# Patient Record
Sex: Male | Born: 1970 | Hispanic: Yes | Marital: Single | Smoking: Never smoker
Health system: Southern US, Community
[De-identification: ages and names within clinical notes are randomized; demographics above are authoritative.]

## PROBLEM LIST (undated history)

## (undated) DIAGNOSIS — H11003 Unspecified pterygium of eye, bilateral: Secondary | ICD-10-CM

## (undated) DIAGNOSIS — E785 Hyperlipidemia, unspecified: Secondary | ICD-10-CM

## (undated) DIAGNOSIS — S0990XA Unspecified injury of head, initial encounter: Secondary | ICD-10-CM

## (undated) HISTORY — DX: Hyperlipidemia, unspecified: E78.5

## (undated) HISTORY — PX: NO PAST SURGERIES: SHX2092

## (undated) HISTORY — DX: Unspecified pterygium of eye, bilateral: H11.003

---

## 1998-04-02 ENCOUNTER — Emergency Department (HOSPITAL_COMMUNITY): Admission: EM | Admit: 1998-04-02 | Discharge: 1998-04-02 | Payer: Self-pay | Admitting: Emergency Medicine

## 1999-08-31 ENCOUNTER — Emergency Department (HOSPITAL_COMMUNITY): Admission: EM | Admit: 1999-08-31 | Discharge: 1999-08-31 | Payer: Self-pay | Admitting: Emergency Medicine

## 2001-12-12 ENCOUNTER — Emergency Department (HOSPITAL_COMMUNITY): Admission: EM | Admit: 2001-12-12 | Discharge: 2001-12-12 | Payer: Self-pay | Admitting: Emergency Medicine

## 2002-06-30 ENCOUNTER — Emergency Department (HOSPITAL_COMMUNITY): Admission: EM | Admit: 2002-06-30 | Discharge: 2002-06-30 | Payer: Self-pay | Admitting: Emergency Medicine

## 2002-06-30 ENCOUNTER — Encounter: Payer: Self-pay | Admitting: Emergency Medicine

## 2004-10-26 ENCOUNTER — Emergency Department (HOSPITAL_COMMUNITY): Admission: EM | Admit: 2004-10-26 | Discharge: 2004-10-26 | Payer: Self-pay | Admitting: Emergency Medicine

## 2005-03-02 ENCOUNTER — Emergency Department (HOSPITAL_COMMUNITY): Admission: EM | Admit: 2005-03-02 | Discharge: 2005-03-03 | Payer: Self-pay | Admitting: Emergency Medicine

## 2011-02-14 ENCOUNTER — Emergency Department (HOSPITAL_COMMUNITY)
Admission: EM | Admit: 2011-02-14 | Discharge: 2011-02-14 | Disposition: A | Discharge status: Home / Self Care | Payer: Self-pay | Attending: Emergency Medicine | Admitting: Emergency Medicine

## 2011-02-14 DIAGNOSIS — L299 Pruritus, unspecified: Secondary | ICD-10-CM | POA: Insufficient documentation

## 2011-02-14 DIAGNOSIS — L408 Other psoriasis: Secondary | ICD-10-CM | POA: Insufficient documentation

## 2011-09-09 ENCOUNTER — Emergency Department (HOSPITAL_COMMUNITY)
Admission: EM | Admit: 2011-09-09 | Discharge: 2011-09-09 | Disposition: A | Discharge status: Home / Self Care | Payer: Self-pay | Attending: Emergency Medicine | Admitting: Emergency Medicine

## 2011-09-09 DIAGNOSIS — R21 Rash and other nonspecific skin eruption: Secondary | ICD-10-CM | POA: Insufficient documentation

## 2011-09-09 DIAGNOSIS — L2989 Other pruritus: Secondary | ICD-10-CM | POA: Insufficient documentation

## 2011-09-09 DIAGNOSIS — L259 Unspecified contact dermatitis, unspecified cause: Secondary | ICD-10-CM | POA: Insufficient documentation

## 2011-09-09 DIAGNOSIS — L298 Other pruritus: Secondary | ICD-10-CM | POA: Insufficient documentation

## 2011-10-03 ENCOUNTER — Emergency Department (HOSPITAL_COMMUNITY)
Admission: EM | Admit: 2011-10-03 | Discharge: 2011-10-03 | Disposition: A | Discharge status: Home / Self Care | Payer: Self-pay | Attending: Emergency Medicine | Admitting: Emergency Medicine

## 2011-10-03 DIAGNOSIS — R21 Rash and other nonspecific skin eruption: Secondary | ICD-10-CM | POA: Insufficient documentation

## 2011-10-03 DIAGNOSIS — L259 Unspecified contact dermatitis, unspecified cause: Secondary | ICD-10-CM | POA: Insufficient documentation

## 2011-10-03 DIAGNOSIS — M79609 Pain in unspecified limb: Secondary | ICD-10-CM | POA: Insufficient documentation

## 2012-05-28 ENCOUNTER — Encounter (HOSPITAL_COMMUNITY): Payer: Self-pay | Admitting: Emergency Medicine

## 2012-05-28 ENCOUNTER — Emergency Department (HOSPITAL_COMMUNITY)
Admission: EM | Admit: 2012-05-28 | Discharge: 2012-05-28 | Disposition: A | Discharge status: Home / Self Care | Payer: Self-pay | Attending: Emergency Medicine | Admitting: Emergency Medicine

## 2012-05-28 DIAGNOSIS — R21 Rash and other nonspecific skin eruption: Secondary | ICD-10-CM

## 2012-05-28 DIAGNOSIS — B350 Tinea barbae and tinea capitis: Secondary | ICD-10-CM

## 2012-05-28 MED ORDER — HYDROXYZINE HCL 25 MG PO TABS: 25.0000 mg | ORAL_TABLET | Freq: Four times a day (QID) | ORAL | Status: AC

## 2012-05-28 MED ORDER — GRISEOFULVIN MICROSIZE 500 MG PO TABS: 500.0000 mg | ORAL_TABLET | Freq: Every day | ORAL | Status: AC

## 2012-05-28 NOTE — Discharge Instructions (Signed)
Infeccin por hongos de la piel (Fungus Infection of the Skin) Una infeccin en la piel causada por un hongo es un problema muy comn. El tratamiento depende de la parte del cuerpo que est afectada. Los tipos de infeccin fngica incluyen:  Pie de atleta (tinea pedis). Esta infeccin comienza entre los dedos de los pies y puede involucrar toda la planta y lados de los pies. Es la enfermedad por hongos ms frecuente. Empeora con el calor, la humedad y la friccin. Para tratarla, lvese los pies entre 2 y 3 veces por Futures trader. Squelos bien, [employer]. Use polvo para pies o cremas con medicamentos, segn las indicaciones del envase. Puede Curator las medias y zapatos talco comn, [place] o polvo de arroz para Pharmacologist los pies secos. Evitar calzado oclusivo tambin es muy til. Tambin es conveniente usar calzado deportivo que permita la ventilacin.   Tia (Tinea corporis y tinea capitis). Esta infeccin ocasiona anillos rojizos y [employer] se forman en la piel o cuero cabeludo. Para las llagas de la piel, aplique una locin medicada o crema segn lo indique en el envase. Para el cuero cabelludo, puede utilizar un champ medicinal junto con otras terapias. La tia del cuero cabelludo normalmente requiere la utilizacin de un medicamento oral por 2 a 4 meses.   Tia versicolor. Esta infeccin aparece como zonas de piel decolorada, escamosa, y despareja (blanquecina a marrn claro). Es ms comn en el verano y en zonas grasosas de la piel como aquellas que se encuentran el pecho, abdomen, espalda, pubis, cuello y pliegues de la piel. Puede tratarse con champ medicinal o con cremas de uso tpico. Los antifngicos por va oral son necesarios en caso de infecciones ms activas. Los puntos claros u oscuros pueden tomar Astronomer y no son un sntoma de Education administrator.  Las infecciones fngicas pueden tomar hasta varias semanas en curarse. Es importante no tratar las  infecciones fngicas con esteroides o medicamentos combinados que contengan antifngicos y esteroides porque stos podran hacer que la infeccin empeore. Consulte con el profesional que lo asiste si no mejora en [employer]. SOLICITE ATENCIN MDICA SI:  Tiene picazn o asperezas persistentes.   La temperatura oral se eleva sin motivo por arriba de 102 F (38.9 C).  Document Released: 12/08/2005 Document Revised: 11/27/2011 [facility] Patient Information 2012 [place], [place].    Resource Guide (Spanish)   Problemas Dentales  Pacientes con Medicaid    :                                                               [facility]   [employer] [phone]                          [phone] [address] [place], [place]                          [place], [place]                                             Phone:  [phone]               Phone:  [phone] Si usted no puede pagar o no tiene Water engineer con: [facility] o el departamento de salud de [facility] para que le autoricen el uso de la Therapist, art para adultos.  Problemas con Dolor Crnico Pngase en contacto con la clinica de dolores cronicos en el hospital de [place], [phone].   Los pacientes deben ser [employer] por su medico de cabecera.  Si usted no tiene dinero para pagar sus medicinas Pngase en contacto con [employer]:  llame al "211" o Insurance underwriter.   Phone: [phone]  Si usted no tiene mdico de [employer]      Phone: [phone]. Otras agencias que ofrecen cuidado mdico barato son:      [facility] [place]  [phone]      [facility]  [phone]      [facility]  [phone]      [facility]  [phone]      [facility]  [phone]      [facility]  [phone]  [facility]  [phone] [facility]  [phone] [facility]   [phone]  (servicios de emergencia [phone])  Abuse/Neglect [facility] Child Abuse Hotline [phone] [facility] Child Abuse Hotline [phone] (After Hours)  Emergency Shelter [facility] [phone]  Maternity Homes Room at the [place] [facility] [place] [phone] [place] Alert Services [phone]  MRSA Hotline #:   (941)684-7599  Servicios de [facility] [place]    United Way           [facility] Dept. [address]                                    [address]       [address]                                         Cristobal Goldmann Phone:  [phone]                            Phone: [phone]                Phone: [phone]  [facility] Phone:  [phone]  [facility] Child Abuse Hotline [phone] (After Hours)

## 2012-05-28 NOTE — ED Provider Notes (Signed)
History   This chart was scribed for Clinton Kras, MD by Shari Heritage. The patient was seen in room STRE1/STRE1. Patient's care was started at 1547.     CSN: [id number]  Arrival date & time 05/28/12  1547   None     Chief Complaint  Patient presents with  . Rash    (Consider location/radiation/quality/duration/timing/severity/associated sxs/prior treatment) The history is provided by the patient and a relative. No language interpreter was used.   Clinton Anderson is a 41 y.o. male who presents to the Emergency Department complaining of rash to back of head and left hand with associated itching. Patient denies pain. Patient has visited the ED for the same rash and was given cream. Patient says that cream gets rid of the rash, but it eventually comes back. Patient does not have a PCP. Patient reports no other pertinent medical or surgical histories. Patient has never smoked.     History reviewed. No pertinent past medical history.  History reviewed. No pertinent past surgical history.  History reviewed. No pertinent family history.  History  Substance Use Topics  . Smoking status: Never Smoker   . Smokeless tobacco: Not on file  . Alcohol Use: Yes     occasion      Review of Systems A complete 10 system review of systems was obtained and all systems are negative except as noted in the HPI and PMH.   Allergies  Review of patient's allergies indicates no known allergies.  Home Medications  No current outpatient prescriptions on file.  BP 117/75  Pulse 82  Temp(Src) 98.3 F (36.8 C) (Oral)  Resp 18  SpO2 100%  Physical Exam  Nursing note and vitals reviewed. Constitutional: He appears well-developed and well-nourished. No distress.  HENT:  Head: Normocephalic and atraumatic.  Right Ear: External ear normal.  Left Ear: External ear normal.  Eyes: Conjunctivae are normal. Right eye exhibits no discharge. Left eye exhibits no discharge. No scleral icterus.  Neck:  Neck supple. No tracheal deviation present.  Cardiovascular: Normal rate.   Pulmonary/Chest: Effort normal. No stridor. No respiratory distress.  Musculoskeletal: He exhibits no edema.  Neurological: He is alert. Cranial nerve deficit: no gross deficits.  Skin: Skin is warm and dry. No rash noted. Rash is not papular and not pustular. There is erythema (Posterior scalp).       Calloused area on hands. On posterior scalp area there is an area of scaling and erythema 3x3 cm area. No alopecia.  Psychiatric: He has a normal mood and affect.    ED Course  Procedures (including critical care time) DIAGNOSTIC STUDIES: Oxygen Saturation is 100% on room air, normal by my interpretation.    COORDINATION OF CARE: 5:02PM - Patient informed of current plan for treatment and evaluation and agrees with plan at this time. Will prescribe anti-fungal medication and medication to alleviate itching. Will provide names of PCP and dermatologist for patient to follow up.   Labs Reviewed - No data to display No results found.   1. Rash   2. Tinea capitis       MDM  Patient's scalp rash appears consistent with tinea capitis. The lesions on his hands are consistent with calluses. At this time there does not appear to be evidence of an acute emergency medical condition. I instructed the patient to not drink any alcohol while taking this medication. I recommended he follow up with primary care Dr. or consider seeing a dermatologist  I personally performed the services described in this documentation, which was scribed in my presence.  The recorded information has been reviewed and considered.    Clinton Kras, MD 05/28/12 [phone]

## 2012-05-28 NOTE — ED Notes (Signed)
Pt noticed rash to back of head and to L hand; c/o itching, denies pain- reports has been here before and given medicine (cream) to place on it and it does get rid of rash, but always comes back

## 2012-08-22 ENCOUNTER — Encounter (HOSPITAL_COMMUNITY): Payer: Self-pay | Admitting: *Deleted

## 2012-08-22 ENCOUNTER — Emergency Department (HOSPITAL_COMMUNITY)
Admission: EM | Admit: 2012-08-22 | Discharge: 2012-08-22 | Disposition: A | Discharge status: Home / Self Care | Payer: Self-pay | Attending: Emergency Medicine | Admitting: Emergency Medicine

## 2012-08-22 DIAGNOSIS — R220 Localized swelling, mass and lump, head: Secondary | ICD-10-CM | POA: Insufficient documentation

## 2012-08-22 DIAGNOSIS — H9209 Otalgia, unspecified ear: Secondary | ICD-10-CM | POA: Insufficient documentation

## 2012-08-22 DIAGNOSIS — R221 Localized swelling, mass and lump, neck: Secondary | ICD-10-CM | POA: Insufficient documentation

## 2012-08-22 NOTE — ED Notes (Signed)
Pt states facial swelling x 3 days. Pt has a slight redness to left side of face near left ear. Pt states hard to hear from left ear and painful. Pt states lots of water coming out of ear.

## 2012-08-22 NOTE — ED Notes (Signed)
Called no answer

## 2012-12-22 DIAGNOSIS — S0990XA Unspecified injury of head, initial encounter: Secondary | ICD-10-CM

## 2012-12-22 HISTORY — DX: Unspecified injury of head, initial encounter: S09.90XA

## 2013-05-14 ENCOUNTER — Encounter (HOSPITAL_COMMUNITY): Payer: Self-pay | Admitting: Family Medicine

## 2013-05-14 ENCOUNTER — Emergency Department (HOSPITAL_COMMUNITY): Payer: Self-pay

## 2013-05-14 ENCOUNTER — Emergency Department (HOSPITAL_COMMUNITY)
Admission: EM | Admit: 2013-05-14 | Discharge: 2013-05-14 | Disposition: A | Discharge status: Home / Self Care | Payer: Self-pay | Attending: Emergency Medicine | Admitting: Emergency Medicine

## 2013-05-14 DIAGNOSIS — S0180XA Unspecified open wound of other part of head, initial encounter: Secondary | ICD-10-CM | POA: Insufficient documentation

## 2013-05-14 DIAGNOSIS — S0990XA Unspecified injury of head, initial encounter: Secondary | ICD-10-CM | POA: Insufficient documentation

## 2013-05-14 DIAGNOSIS — S0101XA Laceration without foreign body of scalp, initial encounter: Secondary | ICD-10-CM

## 2013-05-14 DIAGNOSIS — F1010 Alcohol abuse, uncomplicated: Secondary | ICD-10-CM | POA: Insufficient documentation

## 2013-05-14 NOTE — ED Notes (Signed)
Dr. Patria Mane at bedside for assessment
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
## 2013-05-14 NOTE — ED Notes (Signed)
Upon this RN arriving to room pt had removed C-collar. Pt instructed to keep collar on, pt reminded of importance of C-collar. Pt's son at bedside to translate. C-collar put back on by this RN
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
## 2013-05-14 NOTE — ED Notes (Signed)
Family members at bedside
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
## 2013-05-14 NOTE — ED Notes (Signed)
Pt returned from radiology.

## 2013-05-14 NOTE — ED Notes (Signed)
Pt comfortable with d/c and f/u instructions. Pt verbalized understanding. Pt's son at bedside for translation, discharge instructions printed in Spanish. No prescriptions

## 2013-05-14 NOTE — ED Notes (Signed)
Dr. Patria Mane at bedside to suture.

## 2013-05-14 NOTE — ED Notes (Signed)
C-collar removed per Dr. Patria Mane. Pt resting comfortably, respirations EU. Family remains at bedside. Bleeding controlled at laceration.
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
## 2013-05-14 NOTE — ED Notes (Signed)
Pt's daughter and son at bedside with [employer] and [employer]. Son states is able to translate. Son used for medical translation.

## 2013-05-15 ENCOUNTER — Encounter (HOSPITAL_COMMUNITY): Payer: Self-pay | Admitting: *Deleted

## 2013-05-15 ENCOUNTER — Emergency Department (HOSPITAL_COMMUNITY)
Admission: EM | Admit: 2013-05-15 | Discharge: 2013-05-15 | Disposition: A | Discharge status: Home / Self Care | Payer: Self-pay | Attending: Emergency Medicine | Admitting: Emergency Medicine

## 2013-05-15 DIAGNOSIS — Z4801 Encounter for change or removal of surgical wound dressing: Secondary | ICD-10-CM | POA: Insufficient documentation

## 2013-05-15 DIAGNOSIS — IMO0001 Reserved for inherently not codable concepts without codable children: Secondary | ICD-10-CM

## 2013-05-15 DIAGNOSIS — R51 Headache: Secondary | ICD-10-CM | POA: Insufficient documentation
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
## 2013-05-16 NOTE — ED Provider Notes (Signed)
Medical screening examination/treatment/procedure(s) were performed by non-physician practitioner and as supervising physician I was immediately available for consultation/collaboration.  Donnetta Hutching, MD 05/16/13 [phone]

## 2013-05-22 ENCOUNTER — Emergency Department (HOSPITAL_COMMUNITY)
Admission: EM | Admit: 2013-05-22 | Discharge: 2013-05-22 | Disposition: A | Discharge status: Home / Self Care | Payer: Self-pay | Attending: Emergency Medicine | Admitting: Emergency Medicine

## 2013-05-22 DIAGNOSIS — Y849 Medical procedure, unspecified as the cause of abnormal reaction of the patient, or of later complication, without mention of misadventure at the time of the procedure: Secondary | ICD-10-CM | POA: Insufficient documentation

## 2013-05-22 DIAGNOSIS — T888XXD Other specified complications of surgical and medical care, not elsewhere classified, subsequent encounter: Secondary | ICD-10-CM

## 2013-05-22 DIAGNOSIS — IMO0002 Reserved for concepts with insufficient information to code with codable children: Secondary | ICD-10-CM | POA: Insufficient documentation

## 2013-05-22 MED ORDER — MUPIROCIN CALCIUM 2 % EX CREA: TOPICAL_CREAM | Freq: Three times a day (TID) | CUTANEOUS | Status: DC

## 2013-05-22 MED ORDER — CEPHALEXIN 500 MG PO CAPS: 500.0000 mg | ORAL_CAPSULE | Freq: Three times a day (TID) | ORAL | Status: DC

## 2013-05-22 NOTE — ED Notes (Signed)
Pt given damp gauze to soak on laceration.

## 2013-05-22 NOTE — ED Notes (Signed)
Pt here to have head wound check. Stapled on Thursday. Swelling and redness noticed
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
## 2013-06-20 ENCOUNTER — Encounter (HOSPITAL_COMMUNITY): Payer: Self-pay | Admitting: Emergency Medicine

## 2013-06-20 ENCOUNTER — Emergency Department (HOSPITAL_COMMUNITY)
Admission: EM | Admit: 2013-06-20 | Discharge: 2013-06-20 | Disposition: A | Discharge status: Home / Self Care | Payer: Self-pay | Attending: Emergency Medicine | Admitting: Emergency Medicine

## 2013-06-20 DIAGNOSIS — Z4801 Encounter for change or removal of surgical wound dressing: Secondary | ICD-10-CM | POA: Insufficient documentation

## 2013-06-20 DIAGNOSIS — Z5189 Encounter for other specified aftercare: Secondary | ICD-10-CM
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
## 2013-06-21 NOTE — ED Provider Notes (Signed)
Medical screening examination/treatment/procedure(s) were performed by non-physician practitioner and as supervising physician I was immediately available for consultation/collaboration.  Saoirse Legere, MD 06/21/13 1536 

## 2014-01-24 ENCOUNTER — Emergency Department (HOSPITAL_COMMUNITY)
Admission: EM | Admit: 2014-01-24 | Discharge: 2014-01-24 | Disposition: A | Discharge status: Home / Self Care | Payer: Self-pay | Attending: Emergency Medicine | Admitting: Emergency Medicine

## 2014-01-24 ENCOUNTER — Encounter (HOSPITAL_COMMUNITY): Payer: Self-pay | Admitting: Emergency Medicine

## 2014-01-24 DIAGNOSIS — Z87828 Personal history of other (healed) physical injury and trauma: Secondary | ICD-10-CM | POA: Insufficient documentation

## 2014-01-24 DIAGNOSIS — R21 Rash and other nonspecific skin eruption: Secondary | ICD-10-CM | POA: Insufficient documentation

## 2014-01-24 HISTORY — DX: Unspecified injury of head, initial encounter: S09.90XA

## 2014-01-24 MED ORDER — MUPIROCIN CALCIUM 2 % EX CREA: 1.0000 "application " | TOPICAL_CREAM | Freq: Three times a day (TID) | CUTANEOUS | Status: AC

## 2014-01-24 MED ORDER — TERBINAFINE HCL 1 % EX CREA: 1.0000 "application " | TOPICAL_CREAM | Freq: Two times a day (BID) | CUTANEOUS | Status: AC
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
## 2014-01-24 NOTE — ED Notes (Signed)
Pt reports having a rash to his Left hand x3 months, states he has been here several times for the same

## 2014-01-24 NOTE — ED Provider Notes (Signed)
Medical screening examination/treatment/procedure(s) were performed by non-physician practitioner and as supervising physician I was immediately available for consultation/collaboration.  Charlotte Fidalgo E Colbi Staubs, MD 01/24/14 2054 

## 2014-01-24 NOTE — Discharge Instructions (Signed)
Candidiasis cutánea   (Cutaneous Candidiasis)  La candidiasis cutánea es un trastorno en el que hay un desarrollo excesivo de hongos (Cándida) en la piel. Los hongos normalmente viven en la piel, pero en pequeñas cantidades y no causan ningún síntoma. En ciertos casos, un mayor desarrollo de los hongos puede causar una verdadera infección por hongos. Este tipo de infección ocurre generalmente en áreas de la piel que son constantemente cálidas y húmedas, como las axilas o la ingle. El hongo es la causa más común de dermatitis del pañal en los bebés y en personas que no pueden controlar sus movimientos intestinales (incontinencia).   CAUSAS   El hongo que causa candidiasis cutánea con más frecuencia es Candida albicans. Las condiciones que pueden aumentar el riesgo de contraer una infección por hongos en la piel son:   · Obesidad.  · Embarazo.  · Diabetes.  · Tomar antibióticos.  · Tomar píldoras anticonceptivas.  · Tomar corticoides.  · La enfermedad tiroidea.  · Una deficiencia de hierro o zinc.  · Problemas del sistema inmunológico.  SÍNTOMAS   · Zona de la piel roja e hinchada.  · Bultos en la piel.  · Picazón.  DIAGNÓSTICO   El diagnóstico de la candidiasis cutánea se basa generalmente en su apariencia. Podrán realizarle unos ligeros raspados en la piel que se observarán bajo un microscopio para determinar la presencia de hongos.   TRATAMIENTO   Cremas antimicóticas pueden aplicarse sobre la piel infectada. En los casos graves, pueden ser necesario tomar medicamentos por vía oral.   INSTRUCCIONES PARA EL CUIDADO EN EL HOGAR   · Mantenga la piel limpia y seca.  · Mantenga un peso saludable.  · Si tiene diabetes, mantenga bajo control el nivel de azúcar en la sangre.  SOLICITE ATENCIÓN MÉDICA DE INMEDIATO SI:   · Su erupción continúa extendiéndose a pesar del tratamiento.  · Tiene fiebre, siente escalofríos o dolor abdominal.  Document Released: 11/27/2011 Document Revised: 03/01/2012  [employer]® Patient Information  ©2014 [employer].

## 2014-10-10 ENCOUNTER — Encounter (HOSPITAL_COMMUNITY): Payer: Self-pay | Admitting: Emergency Medicine

## 2014-10-10 ENCOUNTER — Emergency Department (HOSPITAL_COMMUNITY): Payer: No Typology Code available for payment source

## 2014-10-10 ENCOUNTER — Emergency Department (HOSPITAL_COMMUNITY)
Admission: EM | Admit: 2014-10-10 | Discharge: 2014-10-10 | Disposition: A | Discharge status: Home / Self Care | Payer: No Typology Code available for payment source | Attending: Emergency Medicine | Admitting: Emergency Medicine

## 2014-10-10 DIAGNOSIS — S299XXA Unspecified injury of thorax, initial encounter: Secondary | ICD-10-CM | POA: Diagnosis not present

## 2014-10-10 DIAGNOSIS — S8992XA Unspecified injury of left lower leg, initial encounter: Secondary | ICD-10-CM | POA: Insufficient documentation

## 2014-10-10 DIAGNOSIS — Z79899 Other long term (current) drug therapy: Secondary | ICD-10-CM | POA: Diagnosis not present

## 2014-10-10 DIAGNOSIS — M25562 Pain in left knee: Secondary | ICD-10-CM

## 2014-10-10 DIAGNOSIS — R0789 Other chest pain: Secondary | ICD-10-CM

## 2014-10-10 DIAGNOSIS — Y92410 Unspecified street and highway as the place of occurrence of the external cause: Secondary | ICD-10-CM | POA: Diagnosis not present

## 2014-10-10 DIAGNOSIS — Y9389 Activity, other specified: Secondary | ICD-10-CM | POA: Diagnosis not present

## 2014-10-10 MED ORDER — HYDROCODONE-ACETAMINOPHEN 5-325 MG PO TABS
2.0000 | ORAL_TABLET | Freq: Once | ORAL | Status: AC
  Administered 2014-10-10: 2 via ORAL
  Filled 2014-10-10: qty 2

## 2014-10-10 NOTE — ED Notes (Signed)
Pt ambulated to restroom with steady gait.

## 2014-10-10 NOTE — Discharge Instructions (Signed)
Return to the emergency room with worsening of symptoms, new symptoms or with symptoms that are concerning, especially chest pain that feels like a pressure, spreads to left arm or jaw, worse with exertion, associated with nausea, vomiting, shortness of breath and/or sweating.  RICE: Rest, Ice (three cycles of 20 mins on, 20mins off at least twice a day), compression/brace, elevation. Heating pad works well for back pain. Ibuprofen 400mg  (2 tablets 200mg ) every 5-6 hours for 3-5 days and then as needed for pain. Follow up with PCP or urgent care if symptoms worsen or are persistent.  You have been diagnosed by your caregiver as having chest wall pain. SEEK IMMEDIATE MEDICAL ATTENTION IF: You develop a fever.  Your chest pains become severe or intolerable.  You develop new, unexplained symptoms (problems).  You develop shortness of breath, nausea, vomiting, sweating or feel light headed.  You develop a new cough or you cough up blood.
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
## 2014-10-10 NOTE — ED Notes (Signed)
Pt also reporting left knee pain along with tenderness to chest with palpation.
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
## 2014-10-13 NOTE — ED Provider Notes (Signed)
Medical screening examination/treatment/procedure(s) were performed by non-physician practitioner and as supervising physician I was immediately available for consultation/collaboration.   EKG Interpretation None        Samuel JesterKathleen Amberlea Spagnuolo, DO 10/13/14 1531

## 2015-12-21 ENCOUNTER — Encounter: Payer: Self-pay | Admitting: Internal Medicine

## 2015-12-21 ENCOUNTER — Ambulatory Visit (INDEPENDENT_AMBULATORY_CARE_PROVIDER_SITE_OTHER): Payer: Self-pay | Admitting: Internal Medicine

## 2015-12-21 VITALS — BP 118/80 | HR 76 | Ht 59.0 in | Wt 148.0 lb

## 2015-12-21 DIAGNOSIS — Z23 Encounter for immunization: Secondary | ICD-10-CM

## 2015-12-21 DIAGNOSIS — H11003 Unspecified pterygium of eye, bilateral: Secondary | ICD-10-CM | POA: Insufficient documentation

## 2015-12-21 MED ORDER — CARBOXYMETHYLCELLULOSE SODIUM 0.5 % OP SOLN: OPHTHALMIC | Status: DC
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
## 2015-12-21 NOTE — Patient Instructions (Signed)

## 2016-02-08 ENCOUNTER — Encounter: Payer: Self-pay | Admitting: Internal Medicine

## 2017-01-05 ENCOUNTER — Emergency Department (HOSPITAL_COMMUNITY)
Admission: EM | Admit: 2017-01-05 | Discharge: 2017-01-05 | Disposition: A | Discharge status: Home / Self Care | Payer: Self-pay | Attending: Emergency Medicine | Admitting: Emergency Medicine

## 2017-01-05 DIAGNOSIS — B350 Tinea barbae and tinea capitis: Secondary | ICD-10-CM | POA: Insufficient documentation

## 2017-01-05 MED ORDER — TERBINAFINE HCL 250 MG PO TABS
250.0000 mg | ORAL_TABLET | Freq: Every day | ORAL | Status: DC
Start: 2017-01-05 — End: 2017-01-05
  Administered 2017-01-05: 250 mg via ORAL
  Filled 2017-01-05: qty 1

## 2017-01-05 MED ORDER — TERBINAFINE HCL 250 MG PO TABS: 250.0000 mg | ORAL_TABLET | Freq: Every day | ORAL | 0 refills | Status: AC

## 2017-01-05 NOTE — ED Notes (Signed)
plced in room waiting on provider

## 2017-01-05 NOTE — ED Notes (Signed)
Pt denies pain just having itchy sensation in the scalp.

## 2017-01-05 NOTE — ED Notes (Signed)
waitng on meds from pharmacy

## 2017-01-05 NOTE — ED Triage Notes (Signed)
Has a bump on back of head and front of head that itches , has been going on for a while
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
## 2017-04-09 ENCOUNTER — Encounter (HOSPITAL_COMMUNITY): Payer: Self-pay | Admitting: Emergency Medicine

## 2017-04-09 ENCOUNTER — Ambulatory Visit (HOSPITAL_COMMUNITY)
Admission: EM | Admit: 2017-04-09 | Discharge: 2017-04-09 | Disposition: A | Discharge status: Home / Self Care | Payer: Self-pay | Attending: Family Medicine | Admitting: Family Medicine

## 2017-04-09 DIAGNOSIS — H11002 Unspecified pterygium of left eye: Secondary | ICD-10-CM
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
## 2017-04-09 NOTE — ED Triage Notes (Signed)
Here for blurred vision onset x10 years... Reports hx of cataract on bilateral eyes  Also reports left eye irritation associated w/redness  Denies inj/trauma  Unable to obtain vision... Pt reports he can't read  A&O x4... NAD

## 2017-04-09 NOTE — Discharge Instructions (Signed)
You have a condition called pterygium. You will need to follow-up with an eye doctor. I recommend you go to Dr. Lanora Manis Mulberry's office, she is an internist, however she has the ability to refer people to ophthalmologists who do not have insurance. This type of eye growth will need to be surgically removed, and only an ophthalmologist continued this.

## 2017-04-14 ENCOUNTER — Emergency Department (HOSPITAL_COMMUNITY)
Admission: EM | Admit: 2017-04-14 | Discharge: 2017-04-14 | Disposition: A | Discharge status: Home / Self Care | Payer: Self-pay | Attending: Emergency Medicine | Admitting: Emergency Medicine

## 2017-04-14 ENCOUNTER — Encounter (HOSPITAL_COMMUNITY): Payer: Self-pay | Admitting: *Deleted

## 2017-04-14 DIAGNOSIS — R21 Rash and other nonspecific skin eruption: Secondary | ICD-10-CM | POA: Insufficient documentation

## 2017-04-14 LAB — COMPREHENSIVE METABOLIC PANEL
ALT: 31 U/L (ref 17–63)
AST: 31 U/L (ref 15–41)
Albumin: 4.2 g/dL (ref 3.5–5.0)
Alkaline Phosphatase: 108 U/L (ref 38–126)
Anion gap: 8 (ref 5–15)
BUN: 13 mg/dL (ref 6–20)
CALCIUM: 9.5 mg/dL (ref 8.9–10.3)
CHLORIDE: 100 mmol/L — AB (ref 101–111)
CO2: 27 mmol/L (ref 22–32)
CREATININE: 0.62 mg/dL (ref 0.61–1.24)
GFR calc Af Amer: >60 mL/min (ref >60)
GFR calc non Af Amer: >60 mL/min (ref >60)
Glucose, Bld: 95 mg/dL (ref 65–99)
Potassium: 4.1 mmol/L (ref 3.5–5.1)
SODIUM: 135 mmol/L (ref 135–145)
Total Bilirubin: 0.7 mg/dL (ref 0.3–1.2)
Total Protein: 7.7 g/dL (ref 6.5–8.1)

## 2017-04-14 MED ORDER — GRISEOFULVIN ULTRAMICROSIZE 125 MG PO TABS: 250.0000 mg | ORAL_TABLET | Freq: Every day | ORAL | 0 refills | Status: AC
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
## 2017-04-14 NOTE — ED Triage Notes (Signed)
States he was seen in Jan for a rash in his head and given Terbinafine HCL of which isn't  Helping , denies pain c/o itching

## 2017-04-14 NOTE — Discharge Instructions (Signed)
Take medications as prescribed.  Follow-up with her doctor to 2-4 days. If you Do not have a doctor follow up with the clinic listed below.  Return to emergency department for any worsening of the rash, fever, pain, difficult breathing, or any other reaction or worsening concerning symptoms

## 2017-05-21 ENCOUNTER — Ambulatory Visit: Payer: Self-pay | Admitting: Internal Medicine

## 2017-05-29 ENCOUNTER — Ambulatory Visit: Payer: Self-pay | Admitting: Internal Medicine

## 2017-09-13 ENCOUNTER — Inpatient Hospital Stay (HOSPITAL_COMMUNITY): Payer: Self-pay

## 2017-09-13 ENCOUNTER — Emergency Department (HOSPITAL_COMMUNITY): Payer: Self-pay

## 2017-09-13 ENCOUNTER — Inpatient Hospital Stay (HOSPITAL_COMMUNITY): Payer: Self-pay | Admitting: Certified Registered"

## 2017-09-13 ENCOUNTER — Encounter (HOSPITAL_COMMUNITY): Admission: EM | Disposition: A | Discharge status: Home / Self Care | Payer: Self-pay | Source: Home / Self Care

## 2017-09-13 ENCOUNTER — Encounter (HOSPITAL_COMMUNITY): Payer: Self-pay | Admitting: General Surgery

## 2017-09-13 ENCOUNTER — Inpatient Hospital Stay (HOSPITAL_COMMUNITY)
Admission: EM | Admit: 2017-09-13 | Discharge: 2017-09-15 | DRG: 958 | Disposition: A | Discharge status: Home / Self Care | Payer: Self-pay | Attending: General Surgery | Admitting: General Surgery

## 2017-09-13 DIAGNOSIS — F10129 Alcohol abuse with intoxication, unspecified: Secondary | ICD-10-CM | POA: Diagnosis present

## 2017-09-13 DIAGNOSIS — S61211A Laceration without foreign body of left index finger without damage to nail, initial encounter: Secondary | ICD-10-CM | POA: Diagnosis present

## 2017-09-13 DIAGNOSIS — S27322A Contusion of lung, bilateral, initial encounter: Secondary | ICD-10-CM | POA: Diagnosis present

## 2017-09-13 DIAGNOSIS — S61210A Laceration without foreign body of right index finger without damage to nail, initial encounter: Secondary | ICD-10-CM | POA: Diagnosis present

## 2017-09-13 DIAGNOSIS — Z23 Encounter for immunization: Secondary | ICD-10-CM

## 2017-09-13 DIAGNOSIS — S6402XA Injury of ulnar nerve at wrist and hand level of left arm, initial encounter: Principal | ICD-10-CM | POA: Diagnosis present

## 2017-09-13 DIAGNOSIS — S51811A Laceration without foreign body of right forearm, initial encounter: Secondary | ICD-10-CM | POA: Diagnosis present

## 2017-09-13 DIAGNOSIS — S0181XA Laceration without foreign body of other part of head, initial encounter: Secondary | ICD-10-CM | POA: Diagnosis present

## 2017-09-13 DIAGNOSIS — R402362 Coma scale, best motor response, obeys commands, at arrival to emergency department: Secondary | ICD-10-CM | POA: Diagnosis present

## 2017-09-13 DIAGNOSIS — R402132 Coma scale, eyes open, to sound, at arrival to emergency department: Secondary | ICD-10-CM | POA: Diagnosis present

## 2017-09-13 DIAGNOSIS — Z452 Encounter for adjustment and management of vascular access device: Secondary | ICD-10-CM

## 2017-09-13 DIAGNOSIS — S6401XA Injury of ulnar nerve at wrist and hand level of right arm, initial encounter: Secondary | ICD-10-CM | POA: Diagnosis present

## 2017-09-13 DIAGNOSIS — S060X9A Concussion with loss of consciousness of unspecified duration, initial encounter: Secondary | ICD-10-CM | POA: Diagnosis present

## 2017-09-13 DIAGNOSIS — S41119A Laceration without foreign body of unspecified upper arm, initial encounter: Secondary | ICD-10-CM | POA: Diagnosis present

## 2017-09-13 DIAGNOSIS — R402242 Coma scale, best verbal response, confused conversation, at arrival to emergency department: Secondary | ICD-10-CM | POA: Diagnosis present

## 2017-09-13 HISTORY — PX: I&D EXTREMITY: SHX5045

## 2017-09-13 LAB — ETHANOL: Alcohol, Ethyl (B): 260 mg/dL — ABNORMAL HIGH (ref <5)

## 2017-09-13 LAB — COMPREHENSIVE METABOLIC PANEL
ALBUMIN: 4.1 g/dL (ref 3.5–5.0)
ALT: 48 U/L (ref 17–63)
AST: 47 U/L — AB (ref 15–41)
Alkaline Phosphatase: 114 U/L (ref 38–126)
Anion gap: 13 (ref 5–15)
BUN: 6 mg/dL (ref 6–20)
CHLORIDE: 105 mmol/L (ref 101–111)
CO2: 19 mmol/L — AB (ref 22–32)
CREATININE: 0.76 mg/dL (ref 0.61–1.24)
Calcium: 8.8 mg/dL — ABNORMAL LOW (ref 8.9–10.3)
GFR calc Af Amer: >60 mL/min (ref >60)
GLUCOSE: 150 mg/dL — AB (ref 65–99)
POTASSIUM: 3.1 mmol/L — AB (ref 3.5–5.1)
Sodium: 137 mmol/L (ref 135–145)
Total Bilirubin: 0.6 mg/dL (ref 0.3–1.2)
Total Protein: 7.4 g/dL (ref 6.5–8.1)

## 2017-09-13 LAB — BPAM RBC
BLOOD PRODUCT EXPIRATION DATE: 201810122359
Blood Product Expiration Date: 201810112359
ISSUE DATE / TIME: 201809230317
ISSUE DATE / TIME: 201809230317
UNIT TYPE AND RH: 9500
Unit Type and Rh: 9500

## 2017-09-13 LAB — I-STAT CHEM 8, ED
BUN: 7 mg/dL (ref 6–20)
Calcium, Ion: 1.06 mmol/L — ABNORMAL LOW (ref 1.15–1.40)
Chloride: 105 mmol/L (ref 101–111)
Creatinine, Ser: 1 mg/dL (ref 0.61–1.24)
GLUCOSE: 150 mg/dL — AB (ref 65–99)
HCT: 44 % (ref 39.0–52.0)
HEMOGLOBIN: 15 g/dL (ref 13.0–17.0)
POTASSIUM: 3.2 mmol/L — AB (ref 3.5–5.1)
SODIUM: 142 mmol/L (ref 135–145)
TCO2: 20 mmol/L — AB (ref 22–32)

## 2017-09-13 LAB — TYPE AND SCREEN
ABO/RH(D): O POS
Antibody Screen: NEGATIVE
UNIT DIVISION: 0
Unit division: 0

## 2017-09-13 LAB — BPAM FFP
BLOOD PRODUCT EXPIRATION DATE: 201809242359
Blood Product Expiration Date: 201809232359
ISSUE DATE / TIME: 201809230318
ISSUE DATE / TIME: 201809230318
Unit Type and Rh: 600
Unit Type and Rh: 6200

## 2017-09-13 LAB — PREPARE FRESH FROZEN PLASMA
UNIT DIVISION: 0
Unit division: 0

## 2017-09-13 LAB — CDS SEROLOGY

## 2017-09-13 LAB — CBC
HEMATOCRIT: 41.7 % (ref 39.0–52.0)
Hemoglobin: 13.6 g/dL (ref 13.0–17.0)
MCH: 27.4 pg (ref 26.0–34.0)
MCHC: 32.6 g/dL (ref 30.0–36.0)
MCV: 83.9 fL (ref 78.0–100.0)
PLATELETS: 239 10*3/uL (ref 150–400)
RBC: 4.97 MIL/uL (ref 4.22–5.81)
RDW: 14.7 % (ref 11.5–15.5)
WBC: 5.5 10*3/uL (ref 4.0–10.5)

## 2017-09-13 LAB — ABO/RH: ABO/RH(D): O POS

## 2017-09-13 SURGERY — IRRIGATION AND DEBRIDEMENT EXTREMITY
Anesthesia: General | Site: Arm Lower | Laterality: Right

## 2017-09-13 MED ORDER — TETANUS-DIPHTH-ACELL PERTUSSIS 5-2.5-18.5 LF-MCG/0.5 IM SUSP
0.5000 mL | Freq: Once | INTRAMUSCULAR | Status: AC
  Administered 2017-09-13: 0.5 mL via INTRAMUSCULAR

## 2017-09-13 MED ORDER — PROPOFOL 10 MG/ML IV BOLUS
INTRAVENOUS | Status: AC
  Filled 2017-09-13: qty 20

## 2017-09-13 MED ORDER — ALBUMIN HUMAN 5 % IV SOLN
INTRAVENOUS | Status: DC | PRN
  Administered 2017-09-13 (×3): via INTRAVENOUS

## 2017-09-13 MED ORDER — HYDROMORPHONE HCL 1 MG/ML IJ SOLN
0.2500 mg | INTRAMUSCULAR | Status: DC | PRN
  Administered 2017-09-13 (×5): 0.5 mg via INTRAVENOUS

## 2017-09-13 MED ORDER — PANTOPRAZOLE SODIUM 40 MG PO TBEC
40.0000 mg | DELAYED_RELEASE_TABLET | Freq: Every day | ORAL | Status: DC
  Administered 2017-09-14 – 2017-09-15 (×2): 40 mg via ORAL
  Filled 2017-09-13 (×2): qty 1

## 2017-09-13 MED ORDER — SODIUM CHLORIDE 0.9 % IR SOLN
Status: DC | PRN
  Administered 2017-09-13: 3000 mL

## 2017-09-13 MED ORDER — PROMETHAZINE HCL 25 MG/ML IJ SOLN: 6.2500 mg | INTRAMUSCULAR | Status: DC | PRN

## 2017-09-13 MED ORDER — PHENYLEPHRINE HCL 10 MG/ML IJ SOLN
INTRAMUSCULAR | Status: DC | PRN
  Administered 2017-09-13 (×2): 80 ug via INTRAVENOUS

## 2017-09-13 MED ORDER — ONDANSETRON HCL 4 MG/2ML IJ SOLN
INTRAMUSCULAR | Status: DC | PRN
  Administered 2017-09-13: 4 mg via INTRAVENOUS

## 2017-09-13 MED ORDER — IOPAMIDOL (ISOVUE-300) INJECTION 61%
INTRAVENOUS | Status: AC
  Administered 2017-09-13: 100 mL
  Filled 2017-09-13: qty 100

## 2017-09-13 MED ORDER — LACTATED RINGERS IV SOLN
INTRAVENOUS | Status: DC | PRN
  Administered 2017-09-13 (×4): via INTRAVENOUS

## 2017-09-13 MED ORDER — FENTANYL CITRATE (PF) 100 MCG/2ML IJ SOLN
INTRAMUSCULAR | Status: DC | PRN
  Administered 2017-09-13: 50 ug via INTRAVENOUS
  Administered 2017-09-13: 100 ug via INTRAVENOUS

## 2017-09-13 MED ORDER — PHENYLEPHRINE HCL 10 MG/ML IJ SOLN
INTRAVENOUS | Status: DC | PRN
  Administered 2017-09-13: 10:00:00 via INTRAVENOUS
  Administered 2017-09-13: 60 ug/min via INTRAVENOUS

## 2017-09-13 MED ORDER — HYDROMORPHONE HCL 1 MG/ML IJ SOLN
INTRAMUSCULAR | Status: AC
  Filled 2017-09-13: qty 1

## 2017-09-13 MED ORDER — CEFAZOLIN SODIUM-DEXTROSE 2-4 GM/100ML-% IV SOLN
2.0000 g | Freq: Once | INTRAVENOUS | Status: AC
  Administered 2017-09-13: 2 g via INTRAVENOUS
  Filled 2017-09-13: qty 100

## 2017-09-13 MED ORDER — SODIUM CHLORIDE 0.9 % IV SOLN
INTRAVENOUS | Status: DC | PRN
  Administered 2017-09-13: 06:00:00 via INTRAVENOUS

## 2017-09-13 MED ORDER — PROPOFOL 10 MG/ML IV BOLUS
INTRAVENOUS | Status: DC | PRN
  Administered 2017-09-13: 150 mg via INTRAVENOUS

## 2017-09-13 MED ORDER — FENTANYL CITRATE (PF) 100 MCG/2ML IJ SOLN
50.0000 ug | Freq: Once | INTRAMUSCULAR | Status: AC
  Administered 2017-09-13: 50 ug via INTRAVENOUS

## 2017-09-13 MED ORDER — SUGAMMADEX SODIUM 200 MG/2ML IV SOLN
INTRAVENOUS | Status: DC | PRN
  Administered 2017-09-13: 150 mg via INTRAVENOUS

## 2017-09-13 MED ORDER — FENTANYL CITRATE (PF) 250 MCG/5ML IJ SOLN
INTRAMUSCULAR | Status: AC
  Filled 2017-09-13: qty 5

## 2017-09-13 MED ORDER — CEFAZOLIN SODIUM-DEXTROSE 1-4 GM/50ML-% IV SOLN
1.0000 g | Freq: Three times a day (TID) | INTRAVENOUS | Status: DC
  Administered 2017-09-13 – 2017-09-15 (×7): 1 g via INTRAVENOUS
  Filled 2017-09-13 (×8): qty 50

## 2017-09-13 MED ORDER — SENNOSIDES-DOCUSATE SODIUM 8.6-50 MG PO TABS
1.0000 | ORAL_TABLET | Freq: Two times a day (BID) | ORAL | Status: DC
  Administered 2017-09-13 – 2017-09-15 (×4): 1 via ORAL
  Filled 2017-09-13 (×4): qty 1

## 2017-09-13 MED ORDER — LIDOCAINE HCL (CARDIAC) 20 MG/ML IV SOLN
INTRAVENOUS | Status: DC | PRN
  Administered 2017-09-13: 80 mg via INTRAVENOUS

## 2017-09-13 MED ORDER — SUCCINYLCHOLINE CHLORIDE 20 MG/ML IJ SOLN
INTRAMUSCULAR | Status: DC | PRN
  Administered 2017-09-13: 120 mg via INTRAVENOUS

## 2017-09-13 MED ORDER — FENTANYL CITRATE (PF) 100 MCG/2ML IJ SOLN
INTRAMUSCULAR | Status: AC
  Filled 2017-09-13: qty 2

## 2017-09-13 MED ORDER — MIDAZOLAM HCL 2 MG/2ML IJ SOLN
INTRAMUSCULAR | Status: AC
  Filled 2017-09-13: qty 2

## 2017-09-13 MED ORDER — ONDANSETRON 4 MG PO TBDP
4.0000 mg | ORAL_TABLET | Freq: Four times a day (QID) | ORAL | Status: DC | PRN
  Filled 2017-09-13: qty 1

## 2017-09-13 MED ORDER — KCL IN DEXTROSE-NACL 20-5-0.45 MEQ/L-%-% IV SOLN
INTRAVENOUS | Status: DC
  Administered 2017-09-13 – 2017-09-15 (×3): via INTRAVENOUS
  Filled 2017-09-13 (×6): qty 1000

## 2017-09-13 MED ORDER — CEFAZOLIN SODIUM-DEXTROSE 2-3 GM-% IV SOLR
INTRAVENOUS | Status: DC | PRN
  Administered 2017-09-13 (×2): 2 g via INTRAVENOUS

## 2017-09-13 MED ORDER — PANTOPRAZOLE SODIUM 40 MG IV SOLR
40.0000 mg | Freq: Every day | INTRAVENOUS | Status: DC
Start: 2017-09-13 — End: 2017-09-14

## 2017-09-13 MED ORDER — 0.9 % SODIUM CHLORIDE (POUR BTL) OPTIME
TOPICAL | Status: DC | PRN
  Administered 2017-09-13: 1000 mL

## 2017-09-13 MED ORDER — OXYCODONE HCL 5 MG PO TABS
10.0000 mg | ORAL_TABLET | ORAL | Status: DC | PRN
  Administered 2017-09-13 – 2017-09-15 (×6): 10 mg via ORAL
  Filled 2017-09-13 (×6): qty 2

## 2017-09-13 MED ORDER — VITAMIN C 500 MG PO TABS
1000.0000 mg | ORAL_TABLET | Freq: Every day | ORAL | Status: DC
  Administered 2017-09-14 – 2017-09-15 (×2): 1000 mg via ORAL
  Filled 2017-09-13 (×2): qty 2

## 2017-09-13 MED ORDER — METHOCARBAMOL 500 MG PO TABS
500.0000 mg | ORAL_TABLET | Freq: Four times a day (QID) | ORAL | Status: DC | PRN
  Administered 2017-09-13 – 2017-09-14 (×3): 500 mg via ORAL
  Filled 2017-09-13 (×3): qty 1

## 2017-09-13 MED ORDER — ONDANSETRON HCL 4 MG/2ML IJ SOLN
4.0000 mg | Freq: Four times a day (QID) | INTRAMUSCULAR | Status: DC | PRN
  Administered 2017-09-13: 4 mg via INTRAVENOUS
  Filled 2017-09-13: qty 2

## 2017-09-13 MED ORDER — ROCURONIUM BROMIDE 100 MG/10ML IV SOLN
INTRAVENOUS | Status: DC | PRN
  Administered 2017-09-13: 20 mg via INTRAVENOUS
  Administered 2017-09-13: 30 mg via INTRAVENOUS
  Administered 2017-09-13 (×3): 10 mg via INTRAVENOUS
  Administered 2017-09-13: 20 mg via INTRAVENOUS

## 2017-09-13 MED ORDER — TETANUS-DIPHTH-ACELL PERTUSSIS 5-2.5-18.5 LF-MCG/0.5 IM SUSP
INTRAMUSCULAR | Status: AC
  Filled 2017-09-13: qty 0.5

## 2017-09-13 MED ORDER — ROCURONIUM BROMIDE 10 MG/ML (PF) SYRINGE
PREFILLED_SYRINGE | INTRAVENOUS | Status: AC
  Filled 2017-09-13: qty 5

## 2017-09-13 MED ORDER — MORPHINE SULFATE (PF) 4 MG/ML IV SOLN
2.0000 mg | INTRAVENOUS | Status: DC | PRN
  Administered 2017-09-13: 2 mg via INTRAVENOUS
  Administered 2017-09-13 – 2017-09-14 (×4): 4 mg via INTRAVENOUS
  Filled 2017-09-13 (×5): qty 1

## 2017-09-13 MED ORDER — CEFAZOLIN SODIUM 1 G IJ SOLR
INTRAMUSCULAR | Status: AC
  Filled 2017-09-13: qty 20

## 2017-09-13 SURGICAL SUPPLY — 64 items
BANDAGE ACE 4X5 VEL STRL LF (GAUZE/BANDAGES/DRESSINGS) ×3 IMPLANT
BANDAGE ELASTIC 4 VELCRO ST LF (GAUZE/BANDAGES/DRESSINGS) ×3 IMPLANT
BLADE SURG 15 STRL LF DISP TIS (BLADE) ×2 IMPLANT
BLADE SURG 15 STRL SS (BLADE) ×4
BNDG CONFORM 2 STRL LF (GAUZE/BANDAGES/DRESSINGS) IMPLANT
BNDG GAUZE ELAST 4 BULKY (GAUZE/BANDAGES/DRESSINGS) ×6 IMPLANT
CORD BIPOLAR FORCEPS 12FT (ELECTRODE) ×3 IMPLANT
CORDS BIPOLAR (ELECTRODE) ×6 IMPLANT
COVER SURGICAL LIGHT HANDLE (MISCELLANEOUS) ×3 IMPLANT
CUFF TOURNIQUET SINGLE 18IN (TOURNIQUET CUFF) ×6 IMPLANT
CUFF TOURNIQUET SINGLE 24IN (TOURNIQUET CUFF) IMPLANT
DRAPE EXTREMITY T 121X128X90 (DRAPE) ×3 IMPLANT
DRAPE HALF SHEET 40X57 (DRAPES) ×6 IMPLANT
DRSG ADAPTIC 3X8 NADH LF (GAUZE/BANDAGES/DRESSINGS) ×3 IMPLANT
DRSG EMULSION OIL 3X3 NADH (GAUZE/BANDAGES/DRESSINGS) ×6 IMPLANT
GAUZE SPONGE 4X4 12PLY STRL (GAUZE/BANDAGES/DRESSINGS) ×6 IMPLANT
GAUZE SPONGE 4X4 12PLY STRL LF (GAUZE/BANDAGES/DRESSINGS) ×3 IMPLANT
GAUZE SPONGE 4X4 16PLY XRAY LF (GAUZE/BANDAGES/DRESSINGS) ×6 IMPLANT
GLOVE BIOGEL M 8.0 STRL (GLOVE) ×3 IMPLANT
GLOVE SS BIOGEL STRL SZ 8 (GLOVE) ×1 IMPLANT
GLOVE SUPERSENSE BIOGEL SZ 8 (GLOVE) ×2
GOWN STRL REUS W/ TWL LRG LVL3 (GOWN DISPOSABLE) ×1 IMPLANT
GOWN STRL REUS W/ TWL XL LVL3 (GOWN DISPOSABLE) ×2 IMPLANT
GOWN STRL REUS W/TWL LRG LVL3 (GOWN DISPOSABLE) ×2
GOWN STRL REUS W/TWL XL LVL3 (GOWN DISPOSABLE) ×4
GUIDE NERVE NEURAGEN 1.5MM (Tissue) ×3 IMPLANT
KIT BASIN OR (CUSTOM PROCEDURE TRAY) ×3 IMPLANT
KIT ROOM TURNOVER OR (KITS) ×3 IMPLANT
MANIFOLD NEPTUNE II (INSTRUMENTS) ×3 IMPLANT
NS IRRIG 1000ML POUR BTL (IV SOLUTION) ×3 IMPLANT
PACK ORTHO EXTREMITY (CUSTOM PROCEDURE TRAY) ×3 IMPLANT
PAD ABD 8X10 STRL (GAUZE/BANDAGES/DRESSINGS) ×6 IMPLANT
PAD ARMBOARD 7.5X6 YLW CONV (MISCELLANEOUS) ×3 IMPLANT
PAD CAST 4YDX4 CTTN HI CHSV (CAST SUPPLIES) ×1 IMPLANT
PADDING CAST COTTON 4X4 STRL (CAST SUPPLIES) ×2
PADDING CAST SYNTHETIC 4 (CAST SUPPLIES) ×2
PADDING CAST SYNTHETIC 4X4 STR (CAST SUPPLIES) ×1 IMPLANT
SCRUB BETADINE 4OZ XXX (MISCELLANEOUS) ×6 IMPLANT
SET CYSTO W/LG BORE CLAMP LF (SET/KITS/TRAYS/PACK) ×6 IMPLANT
SOL PREP POV-IOD 4OZ 10% (MISCELLANEOUS) ×6 IMPLANT
SPLINT PLASTER CAST XFAST 5X30 (CAST SUPPLIES) ×1 IMPLANT
SPLINT PLASTER EXTRA FAST 3X15 (CAST SUPPLIES) ×2
SPLINT PLASTER GYPS XFAST 3X15 (CAST SUPPLIES) ×1 IMPLANT
SPLINT PLASTER XFAST SET 5X30 (CAST SUPPLIES) ×2
SPONGE LAP 18X18 X RAY DECT (DISPOSABLE) ×3 IMPLANT
SPONGE LAP 4X18 X RAY DECT (DISPOSABLE) ×3 IMPLANT
STOCKINETTE 4X48 STRL (DRAPES) ×3 IMPLANT
SUT ETHILON 8 0 BV130 4 (SUTURE) ×3 IMPLANT
SUT ETHILON 9 0 BV130 4 (SUTURE) ×15 IMPLANT
SUT FIBERWIRE 3-0 18 TAPR NDL (SUTURE) ×3
SUT FIBERWIRE 4-0 18 TAPR NDL (SUTURE) ×3
SUT PROLENE 3 0 PS 2 (SUTURE) ×15 IMPLANT
SUT PROLENE 4 0 PS 2 18 (SUTURE) ×3 IMPLANT
SUT PROLENE 6 0 CC (SUTURE) ×3 IMPLANT
SUT PROLENE 6 0 PC 1 (SUTURE) ×3 IMPLANT
SUTURE FIBERWR 3-0 18 TAPR NDL (SUTURE) ×1 IMPLANT
SUTURE FIBERWR 4-0 18 TAPR NDL (SUTURE) ×1 IMPLANT
SYR CONTROL 10ML LL (SYRINGE) IMPLANT
TOWEL OR 17X24 6PK STRL BLUE (TOWEL DISPOSABLE) ×3 IMPLANT
TOWEL OR 17X26 10 PK STRL BLUE (TOWEL DISPOSABLE) ×3 IMPLANT
TRAY FOLEY BAG SILVER LF 16FR (SET/KITS/TRAYS/PACK) ×3 IMPLANT
TUBE CONNECTING 12'X1/4 (SUCTIONS) ×1
TUBE CONNECTING 12X1/4 (SUCTIONS) ×2 IMPLANT
YANKAUER SUCT BULB TIP NO VENT (SUCTIONS) ×3 IMPLANT

## 2017-09-13 NOTE — Anesthesia Postprocedure Evaluation (Signed)
Anesthesia Post Note  Patient: Clinton Anderson  Procedure(s) Performed: Procedure(s) (LRB): IRRIGATION AND DEBRIDEMENT EXTREMITY, RIGHT AND LEFT FOREARM WRIST HAND REPAIR OF STRUCTURES AS NECESSARY TENDONS NERVES AND ARTERIES. (Right)     Patient location during evaluation: PACU Anesthesia Type: General Level of consciousness: awake, awake and alert and oriented Pain management: pain level controlled Vital Signs Assessment: post-procedure vital signs reviewed and stable Respiratory status: spontaneous breathing, nonlabored ventilation and respiratory function stable Cardiovascular status: blood pressure returned to baseline Anesthetic complications: no    Last Vitals:  Vitals:   09/13/17 1115 09/13/17 1145  BP: (!) 127/93 132/90  Pulse: (!) 116 (!) 106  Resp: 16 (!) 22  Temp: (!) 36.4 C   SpO2: 98% 94%    Last Pain:  Vitals:   09/13/17 1315  PainSc: 10-Worst pain ever                 Adalberto Metzgar COKER
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
## 2017-09-13 NOTE — Op Note (Signed)
See [id number]#[id number] Clinton Pea MD

## 2017-09-13 NOTE — Anesthesia Procedure Notes (Signed)
Procedure Name: Intubation Date/Time: 09/13/2017 6:02 AM Performed by: Coralee Rud Pre-anesthesia Checklist: Patient identified, Emergency Drugs available, Suction available and Patient being monitored Patient Re-evaluated:Patient Re-evaluated prior to induction Oxygen Delivery Method: Circle system utilized Preoxygenation: Pre-oxygenation with 100% oxygen Induction Type: IV induction Laryngoscope Size: Miller and 3 Grade View: Grade I Tube type: Oral Tube size: 7.5 mm Number of attempts: 1 Airway Equipment and Method: Stylet Placement Confirmation: ETT inserted through vocal cords under direct vision,  positive ETCO2 and breath sounds checked- equal and bilateral
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
## 2017-09-13 NOTE — Transfer of Care (Signed)
Immediate Anesthesia Transfer of Care Note  Patient: Clinton Anderson  Procedure(s) Performed: Procedure(s): IRRIGATION AND DEBRIDEMENT EXTREMITY, RIGHT AND LEFT FOREARM WRIST HAND REPAIR OF STRUCTURES AS NECESSARY TENDONS NERVES AND ARTERIES. (Right)  Patient Location: PACU  Anesthesia Type:General  Level of Consciousness: awake, alert  and patient cooperative  Airway & Oxygen Therapy: Patient Spontanous Breathing and Patient connected to nasal cannula oxygen  Post-op Assessment: Report given to RN, Post -op Vital signs reviewed and stable, Patient moving all extremities and Patient moving all extremities X 4  Post vital signs: Reviewed and stable  Last Vitals:  Vitals:   09/13/17 0445 09/13/17 0515  BP: (!) 142/91 (!) 141/102  Pulse: (!) 148 (!) 131  Resp: 20 (!) 23  Temp:    SpO2: 98% 99%    Last Pain:  Vitals:   09/13/17 0350  PainSc: 10-Worst pain ever         Complications: No apparent anesthesia complications
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
## 2017-09-13 NOTE — ED Notes (Signed)
Family at beside. Family given emotional support. 
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
## 2017-09-13 NOTE — Anesthesia Procedure Notes (Addendum)
Central Venous Catheter Insertion Performed by: Kipp Brood, anesthesiologist Start/End9/23/2018 11:20 AM, 09/13/2017 11:30 AM Patient location: OR. Preanesthetic checklist: patient identified, IV checked, site marked, surgical consent, monitors and equipment checked, pre-op evaluation and timeout performed Position: supine Hand hygiene performed , maximum sterile barriers used  and Seldinger technique used Catheter size: 8 Fr Total catheter length 17. Central line was placed.Procedure performed using ultrasound guided technique. Ultrasound Notes:anatomy identified, needle tip was noted to be adjacent to the nerve/plexus identified and no ultrasound evidence of intravascular and/or intraneural injection Attempts: 1 Following insertion, line sutured, dressing applied and Biopatch. Post procedure assessment: blood return through all ports, free fluid flow and no air  Patient tolerated the procedure well with no immediate complications.

## 2017-09-14 ENCOUNTER — Encounter (HOSPITAL_COMMUNITY): Payer: Self-pay | Admitting: Orthopedic Surgery

## 2017-09-14 LAB — CBC
HEMATOCRIT: 34.2 % — AB (ref 39.0–52.0)
Hemoglobin: 11.3 g/dL — ABNORMAL LOW (ref 13.0–17.0)
MCH: 28.1 pg (ref 26.0–34.0)
MCHC: 33 g/dL (ref 30.0–36.0)
MCV: 85.1 fL (ref 78.0–100.0)
PLATELETS: 173 10*3/uL (ref 150–400)
RBC: 4.02 MIL/uL — AB (ref 4.22–5.81)
RDW: 14.4 % (ref 11.5–15.5)
WBC: 7.3 10*3/uL (ref 4.0–10.5)

## 2017-09-14 LAB — BASIC METABOLIC PANEL
Anion gap: 8 (ref 5–15)
BUN: <5 mg/dL — ABNORMAL LOW (ref 6–20)
CO2: 28 mmol/L (ref 22–32)
Calcium: 8.8 mg/dL — ABNORMAL LOW (ref 8.9–10.3)
Chloride: 98 mmol/L — ABNORMAL LOW (ref 101–111)
Creatinine, Ser: 0.74 mg/dL (ref 0.61–1.24)
GFR calc Af Amer: >60 mL/min (ref >60)
GFR calc non Af Amer: >60 mL/min (ref >60)
Glucose, Bld: 113 mg/dL — ABNORMAL HIGH (ref 65–99)
Potassium: 3.5 mmol/L (ref 3.5–5.1)
Sodium: 134 mmol/L — ABNORMAL LOW (ref 135–145)

## 2017-09-14 LAB — POCT I-STAT 4, (NA,K, GLUC, HGB,HCT)
Glucose, Bld: 100 mg/dL — ABNORMAL HIGH (ref 65–99)
HEMATOCRIT: 27 % — AB (ref 39.0–52.0)
Hemoglobin: 9.2 g/dL — ABNORMAL LOW (ref 13.0–17.0)
Potassium: 3.7 mmol/L (ref 3.5–5.1)
Sodium: 143 mmol/L (ref 135–145)

## 2017-09-14 LAB — HIV ANTIBODY (ROUTINE TESTING W REFLEX): HIV Screen 4th Generation wRfx: NONREACTIVE

## 2017-09-14 LAB — BLOOD PRODUCT ORDER (VERBAL) VERIFICATION

## 2017-09-14 MED ORDER — ACETAMINOPHEN 325 MG PO TABS
650.0000 mg | ORAL_TABLET | Freq: Four times a day (QID) | ORAL | Status: DC
  Administered 2017-09-14 – 2017-09-15 (×4): 650 mg via ORAL
  Filled 2017-09-14 (×5): qty 2

## 2017-09-14 MED ORDER — ALTEPLASE 2 MG IJ SOLR
2.0000 mg | Freq: Once | INTRAMUSCULAR | Status: DC
  Filled 2017-09-14: qty 2

## 2017-09-14 MED ORDER — SODIUM CHLORIDE 0.9% FLUSH
10.0000 mL | INTRAVENOUS | Status: DC | PRN
  Administered 2017-09-14 – 2017-09-15 (×2): 10 mL
  Filled 2017-09-14 (×2): qty 40

## 2017-09-14 MED ORDER — SODIUM CHLORIDE 0.9% FLUSH
10.0000 mL | Freq: Two times a day (BID) | INTRAVENOUS | Status: DC
  Administered 2017-09-14: 10 mL

## 2017-09-14 MED ORDER — ENOXAPARIN SODIUM 40 MG/0.4ML ~~LOC~~ SOLN
40.0000 mg | SUBCUTANEOUS | Status: DC
  Administered 2017-09-14 – 2017-09-15 (×2): 40 mg via SUBCUTANEOUS
  Filled 2017-09-14 (×2): qty 0.4
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
## 2017-09-15 LAB — CBC
HEMATOCRIT: 29.6 % — AB (ref 39.0–52.0)
HEMOGLOBIN: 9.7 g/dL — AB (ref 13.0–17.0)
MCH: 28 pg (ref 26.0–34.0)
MCHC: 32.8 g/dL (ref 30.0–36.0)
MCV: 85.5 fL (ref 78.0–100.0)
Platelets: 164 10*3/uL (ref 150–400)
RBC: 3.46 MIL/uL — AB (ref 4.22–5.81)
RDW: 14.3 % (ref 11.5–15.5)
WBC: 5.6 10*3/uL (ref 4.0–10.5)

## 2017-09-15 MED ORDER — CEPHALEXIN 500 MG PO CAPS: 500.0000 mg | ORAL_CAPSULE | Freq: Four times a day (QID) | ORAL | 0 refills | Status: AC

## 2017-09-15 MED ORDER — OXYCODONE HCL 10 MG PO TABS: 5.0000 mg | ORAL_TABLET | ORAL | 0 refills | Status: DC | PRN

## 2017-09-15 NOTE — Progress Notes (Signed)
Subjective: 2 Days Post-Op Procedure(s) (LRB): IRRIGATION AND DEBRIDEMENT EXTREMITY, RIGHT AND LEFT FOREARM WRIST HAND REPAIR OF STRUCTURES AS NECESSARY TENDONS NERVES AND ARTERIES. (Right) Patient reports pain as controlled with oral medications. His family is in the room. We have discussed all discharge issues with he and his family.  Objective: Vital signs in last 24 hours: Temp:  [98.6 F (37 C)-99.2 F (37.3 C)] 98.8 F (37.1 C) (09/25 0452) Pulse Rate:  [93-96] 96 (09/25 0452) Resp:  [18] 18 (09/25 0452) BP: (130-178)/(78-82) 135/82 (09/25 0452) SpO2:  [93 %-96 %] 96 % (09/25 0452)  Intake/Output from previous day: 09/24 0701 - 09/25 0700 In: 2976.7 [P.O.:480; I.V.:2246.7; IV Piggyback:250] Out: 600 [Urine:600] Intake/Output this shift: No intake/output data recorded.   Recent Labs  09/13/17 0334 09/13/17 0346 09/13/17 0743 09/14/17 0423 09/15/17 0523  HGB 13.6 15.0 9.2* 11.3* 9.7*    Recent Labs  09/14/17 0423 09/15/17 0523  WBC 7.3 5.6  RBC 4.02* 3.46*  HCT 34.2* 29.6*  PLT 173 164    Recent Labs  09/13/17 0334 09/13/17 0346 09/13/17 0743 09/14/17 0423  NA 137 142 143 134*  K 3.1* 3.2* 3.7 3.5  CL 105 105  --  98*  CO2 19*  --   --  28  BUN 6 7  --  <5*  CREATININE 0.76 1.00  --  0.74  GLUCOSE 150* 150* 100* 113*  CALCIUM 8.8*  --   --  8.8*   No results for input(s): LABPT, INR in the last 72 hours.  The patient is alert and oriented in no acute distress. The patient complains of pain in the affected upper extremity.  The patient is noted to have a normal HEENT exam. Lung fields show equal chest expansion and no shortness of breath.he demonstrates excellent incentive spirometry Abdomen exam is nontender without distention. Lower extremity examination does not show any fracture dislocation or blood clot symptoms. Pelvis is stable and the neck and back are stable and nontender. Examination of bilateral upper extremity shows that his splint  and dressings are clean, dry and intact. The exposed digits have normal refill and sensation are intact. He has no signs of infection present.  Assessment/Plan: 2 Days Post-Op Procedure(s) (LRB): IRRIGATION AND DEBRIDEMENT EXTREMITY, RIGHT AND LEFT FOREARM WRIST HAND REPAIR OF STRUCTURES AS NECESSARY TENDONS NERVES AND ARTERIES. (Right) Will be discharged after receiving his 2:00 dose of IV Ancef, we have discussed with he and his family at length all discharge instructions. He will keep his dressings clean dry and intact. He will not remove them. He will return to see [place] in our office setting October 11 at 8 AM we will then have him follow-up with [facility] occupational therapy for splint application and to begin a duran given the zone 2 FDS and FDP repairs about the index fingers. All questions were encouraged and answered. Oxycodone has been written for pain, Keflex for antibiotic purposes as been given  Shakila Mak L 09/15/2017, 1:30 PM
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
## 2017-09-15 NOTE — Discharge Instructions (Signed)
Cuidados del yeso o la férula  (Cast or Splint Care)  El yeso y las férulas sostienen los miembros lesionados y evitan que los huesos se muevan hasta que se curen.  CUIDADOS EN EL HOGAR  · Mantenga el yeso o la férula al descubierto durante el tiempo de secado.  ? El yeso tarda entre 14 y 48 horas en secarse.  ? La fibra de vidrio se seca en menos de 1 hora.  · No apoye el yeso sobre nada que sea más duro que una almohada durante 24 horas.  · No soporte ningún peso sobre el miembro lesionado. No haga presión sobre el yeso. Espere a que el médico lo autorice.  · Mantenga el yeso o la férula secos.  ? Cúbralos con una bolsa plástica cuando se dé un baño o los días de clima húmedo.  ? Si tiene un yeso en el tórax y la cintura (el tronco) báñese con una esponja hasta que se lo quiten.  ? Si el yeso se moja, séquelo con una toalla o un secador de cabello. Utilice el aire frío del secador.  · Mantenga el yeso o la férula limpios. Limpie el yeso sucio con un paño húmedo.  · Noponga objetos extraños debajo del yeso o de la férula.  · No se rasque la piel por debajo del molde con ningún objeto. Si siente picazón, use un secador de cabello con aire frío sobre la zona que pica.  · No recorte ni perfore el yeso.  · No retire el relleno acolchado que se encuentra debajo del yeso.  · Ejercite como le ha indicado el médico las articulaciones que se encuentran cerca del yeso.  · Eleve (levante) el miembro lesionado sobre 1 ó 2 almohadas durante los primeros 1 a 3 días.    SOLICITE AYUDA SI:  · El yeso o la férula se quiebran.  · Siente que el yeso o la férula están muy apretados o muy flojos.  · Siente una picazón intensa por debajo del yeso.  · El yeso se moja o tiene una zona blanda.  · Siente un feo olor que proviene del interior del yeso.  · Algún objeto se queda atascado bajo el yeso.  · La piel que rodea el yeso enrojece o se vuelve sensible.  · Le aparece dolor, o siente más dolor luego de la colocación del yeso.    SOLICITE  AYUDA DE INMEDIATO SI:  · Observa un líquido que sale por el yeso.  · No puede mover los dedos.  · Los dedos están de color azul o blanco, están fríos, le duelen y están inflamados (hinchados).  · Siente hormigueo o pierde la sensibilidad (adormecimiento) alrededor de la zona de la lesión.  · Aumenta el dolor o la presión debajo del yeso.  · Tiene problemas para respirar o le falta el aire.  · Siente dolor en el pecho.    Esta información no tiene como fin reemplazar el consejo del médico. Asegúrese de hacerle al médico cualquier pregunta que tenga.  Document Released: 05/12/2011 Document Revised: 08/10/2013 Document Reviewed: 05/31/2016  Elsevier Interactive Patient Education © 2017 Elsevier Inc.

## 2017-09-15 NOTE — Progress Notes (Signed)
Provided discharge education/instructions, all questions and concerns addressed, Pt not in distress, discharged home accompanied by son and daughter.
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
## 2017-09-16 ENCOUNTER — Encounter (HOSPITAL_COMMUNITY): Payer: Self-pay | Admitting: *Deleted

## 2017-10-01 ENCOUNTER — Ambulatory Visit: Payer: Self-pay | Admitting: Occupational Therapy

## 2017-10-01 ENCOUNTER — Ambulatory Visit: Payer: Self-pay | Attending: Orthopedic Surgery | Admitting: Occupational Therapy

## 2017-10-01 DIAGNOSIS — M6281 Muscle weakness (generalized): Secondary | ICD-10-CM

## 2017-10-01 DIAGNOSIS — M79642 Pain in left hand: Secondary | ICD-10-CM | POA: Insufficient documentation

## 2017-10-01 DIAGNOSIS — M25642 Stiffness of left hand, not elsewhere classified: Secondary | ICD-10-CM

## 2017-10-01 DIAGNOSIS — M25641 Stiffness of right hand, not elsewhere classified: Secondary | ICD-10-CM | POA: Insufficient documentation

## 2017-10-01 DIAGNOSIS — M79641 Pain in right hand: Secondary | ICD-10-CM
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
## 2017-10-01 NOTE — Patient Instructions (Signed)
  WEARING SCHEDULE:  Wear splint at ALL times except for hygiene care (May remove finger strap ONLY for exercises and then immediately place back strap after exercises)  PURPOSE:  To prevent movement and for protection until injury can heal  CARE OF SPLINT:  Keep splint away from heat sources including: stove, radiator or furnace, or a car in sunlight. The splint can melt and will no longer fit you properly  Keep away from pets and children  Clean the splint with rubbing alcohol 1-2 times per day.  * During this time, make sure you also clean your hand/arm as instructed by your therapist and/or perform dressing changes as needed. Then dry hand/arm completely before replacing splint. (When cleaning hand/arm, keep it immobilized in same position until splint is replaced)  PRECAUTIONS/POTENTIAL PROBLEMS: *If you notice or experience increased pain, swelling, numbness, or a lingering reddened area from the splint: Contact your therapist immediately by calling [phone]. You must wear the splint for protection, but we will get you scheduled for adjustments as quickly as possible.  (If only straps or hooks need to be replaced and NO adjustments to the splint need to be made, just call the office ahead and let them know you are coming in)  If you have any medical concerns or signs of infection, please call your doctor immediately   Do exercises 25 repetitions approximately every 2 hours (6 times per day) - see attached sheet

## 2017-10-05 ENCOUNTER — Ambulatory Visit: Payer: Self-pay | Admitting: *Deleted

## 2017-10-05 ENCOUNTER — Encounter: Payer: Self-pay | Admitting: *Deleted

## 2017-10-05 DIAGNOSIS — M79642 Pain in left hand: Secondary | ICD-10-CM

## 2017-10-05 DIAGNOSIS — M25642 Stiffness of left hand, not elsewhere classified: Secondary | ICD-10-CM

## 2017-10-05 DIAGNOSIS — M25641 Stiffness of right hand, not elsewhere classified: Secondary | ICD-10-CM

## 2017-10-05 DIAGNOSIS — M6281 Muscle weakness (generalized): Secondary | ICD-10-CM

## 2017-10-05 DIAGNOSIS — M79641 Pain in right hand: Secondary | ICD-10-CM
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
## 2017-10-05 NOTE — Patient Instructions (Signed)
Left Hand Splint WEARING SCHEDULE:  Wear splint at ALL times except for hygiene care (May remove splint for exercises and then immediately place back on ONLY if directed by the therapist). Do the passive exercise for your index/first finger only. Do not straighten your finger all the way.  PURPOSE:  To prevent movement and for protection until injury can heal  CARE OF SPLINT:  Keep splint away from heat sources including: stove, radiator or furnace, or a car in sunlight. The splint can melt and will no longer fit you properly  Keep away from pets and children  Clean the splint with rubbing alcohol or cool water and soap if it becomes dirty..  * During this time, make sure you also clean your hand/arm as instructed by your therapist and/or perform dressing changes as needed. Then dry hand/arm completely before replacing splint. (When cleaning hand/arm, keep it immobilized in same position until splint is replaced)  PRECAUTIONS/POTENTIAL PROBLEMS: *If you notice or experience increased pain, swelling, numbness, or a lingering reddened area from the splint: Contact your therapist immediately by calling [phone]. You must wear the splint for protection, but we will get you scheduled for adjustments as quickly as possible.  (If only straps or hooks need to be replaced and NO adjustments to the splint need to be made, just call the office ahead and let them know you are coming in)  If you have any medical concerns or signs of infection, please call your doctor immediately

## 2017-10-13 ENCOUNTER — Ambulatory Visit: Payer: Self-pay | Admitting: Occupational Therapy

## 2017-10-13 DIAGNOSIS — M79642 Pain in left hand: Secondary | ICD-10-CM

## 2017-10-13 DIAGNOSIS — M79641 Pain in right hand: Secondary | ICD-10-CM

## 2017-10-13 DIAGNOSIS — M25642 Stiffness of left hand, not elsewhere classified: Secondary | ICD-10-CM

## 2017-10-13 DIAGNOSIS — M25641 Stiffness of right hand, not elsewhere classified: Secondary | ICD-10-CM
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
## 2017-10-13 NOTE — Patient Instructions (Addendum)
  For Rt hand:   Keep splint ON for below:  - continue to have son bend index finger down, then you straighten all the way to the back of the splint (like before).  - also begin actively bending finger down yourself, then straighten all the way to the back of the splint - scar massage 2x/day for 5 minutes.  Applying pressure in deep circles using cocoa butter or cream  For Lt hand:   Will have to remove splint, but do NOT straighten index finger fully (keep slightly bent at big knuckle approx 30 degrees while doing below ex's)   - actively bend fingers down, son can push index finger further down, then hold position, then straighten fingers at middle joints, keeping slight bend at big knuckles  - bend wrist up and down, but let fingers naturally relax (When wrist goes back, fingers should naturally curl. When wrist goes down (forward), fingers should naturally straighten)  - do scar massage to Lt index finger careful NOT to straighten all the way at big knuckle, also do scar massage at back of forearm along incision for 5 minutes each place (see above for directions)
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
## 2017-10-20 ENCOUNTER — Ambulatory Visit: Payer: Self-pay | Admitting: Occupational Therapy

## 2017-10-22 ENCOUNTER — Ambulatory Visit: Payer: Self-pay | Attending: Orthopedic Surgery | Admitting: Occupational Therapy

## 2017-10-22 DIAGNOSIS — M25642 Stiffness of left hand, not elsewhere classified: Secondary | ICD-10-CM | POA: Insufficient documentation

## 2017-10-22 DIAGNOSIS — M6281 Muscle weakness (generalized): Secondary | ICD-10-CM | POA: Insufficient documentation

## 2017-10-22 DIAGNOSIS — M79641 Pain in right hand: Secondary | ICD-10-CM | POA: Insufficient documentation

## 2017-10-22 DIAGNOSIS — M25641 Stiffness of right hand, not elsewhere classified: Secondary | ICD-10-CM | POA: Insufficient documentation

## 2017-10-22 DIAGNOSIS — M79642 Pain in left hand: Secondary | ICD-10-CM | POA: Insufficient documentation

## 2017-10-22 NOTE — Patient Instructions (Signed)
  PERFORM BELOW EXERCISES 6x/DAY ON BOTH HANDS 15 REPETITIONS OF EACH BY REMOVING SPLINT:   1. Make fist, move wrist slowly up and down  2. Make fist, make hook, then straighten fingers  3. Make fist and bend down wrist together, then open fingers and bring wrist back together   DO ABOVE EXERCISE WITH LEFT HAND AS WELL AS BELOW EXERCISES:   1. With wrist down, open fingers. With wrist neutral, open fingers. With wrist back, open fingers. Each position x 5 repetitions (15 reps total)   2. Move wrist side to side with palm down, wrist down and wrist up. Then move wrist side to side with palm up, wrist down and wrist up x 5 reps each  3. Fist to hook x 15 reps (bending and straightening BIG knuckles keeping fingers bent) - Can put high lighter in fingers  4. Place hand on table and lift each finger individually x 5 reps each finger
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
## 2017-10-27 ENCOUNTER — Ambulatory Visit: Payer: Self-pay | Attending: Orthopedic Surgery | Admitting: Occupational Therapy

## 2017-10-27 DIAGNOSIS — M25642 Stiffness of left hand, not elsewhere classified: Secondary | ICD-10-CM | POA: Insufficient documentation

## 2017-10-27 DIAGNOSIS — M79642 Pain in left hand: Secondary | ICD-10-CM | POA: Insufficient documentation

## 2017-10-27 DIAGNOSIS — M79641 Pain in right hand: Secondary | ICD-10-CM | POA: Insufficient documentation

## 2017-10-27 DIAGNOSIS — M25641 Stiffness of right hand, not elsewhere classified: Secondary | ICD-10-CM | POA: Insufficient documentation
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
## 2017-11-03 ENCOUNTER — Ambulatory Visit: Payer: Self-pay | Admitting: Occupational Therapy

## 2017-11-03 DIAGNOSIS — M25641 Stiffness of right hand, not elsewhere classified: Secondary | ICD-10-CM

## 2017-11-03 DIAGNOSIS — M25642 Stiffness of left hand, not elsewhere classified: Secondary | ICD-10-CM

## 2017-11-03 DIAGNOSIS — M79642 Pain in left hand: Secondary | ICD-10-CM
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
## 2017-11-10 ENCOUNTER — Ambulatory Visit: Payer: Self-pay | Admitting: Occupational Therapy

## 2017-11-10 DIAGNOSIS — M25641 Stiffness of right hand, not elsewhere classified: Secondary | ICD-10-CM

## 2017-11-10 DIAGNOSIS — M25642 Stiffness of left hand, not elsewhere classified: Secondary | ICD-10-CM

## 2017-11-10 DIAGNOSIS — M6281 Muscle weakness (generalized): Secondary | ICD-10-CM
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
## 2017-11-10 NOTE — Patient Instructions (Signed)
  DO ALL PUTTY EXERCISES FOR BOTH HANDS (RED PUTTY FOR RIGHT HAND, YELLOW PUTTY FOR LEFT HAND)   1. Grip Strengthening (Resistive Putty)   Squeeze putty using thumb and all fingers. Repeat _15___ times. Do __2__ sessions per day.   2. Roll putty into tube on table and pinch between each finger and thumb x 10 reps each. (Do ring and small finger together). Do 2 sessions per day.    3. IP Fisting (Resistive Putty)    Keeping knuckles straight, bend fingertips to squeeze putty. Repeat _10___ times. Do _2___ sessions per day.   DO BELOW FOR LEFT WRIST  Extension (Resistive)    With LEFT wrist over edge of table, lift _2 LBS___, keeping arm on table surface. Hold __2__ seconds. Lower slowly. Repeat __15__ times. Do __2__ sessions per day.   Flexion (Resistive)    With hand palm-up and holding _2 LBS, bend hand toward you at wrist. Hold __2__ seconds. Relax slowly. Repeat _15___ times. Do __2__ sessions per day.

## 2017-11-17 ENCOUNTER — Ambulatory Visit: Payer: Self-pay | Admitting: Occupational Therapy

## 2017-11-17 DIAGNOSIS — M79642 Pain in left hand: Secondary | ICD-10-CM

## 2017-11-17 DIAGNOSIS — M79641 Pain in right hand: Secondary | ICD-10-CM

## 2017-11-17 DIAGNOSIS — M25642 Stiffness of left hand, not elsewhere classified: Secondary | ICD-10-CM

## 2017-11-17 DIAGNOSIS — M25641 Stiffness of right hand, not elsewhere classified: Secondary | ICD-10-CM

## 2017-11-17 DIAGNOSIS — M6281 Muscle weakness (generalized): Secondary | ICD-10-CM
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
## 2017-11-24 ENCOUNTER — Ambulatory Visit: Payer: Self-pay | Attending: Orthopedic Surgery | Admitting: Occupational Therapy

## 2017-11-24 DIAGNOSIS — M25641 Stiffness of right hand, not elsewhere classified: Secondary | ICD-10-CM | POA: Insufficient documentation

## 2017-11-24 DIAGNOSIS — M25642 Stiffness of left hand, not elsewhere classified: Secondary | ICD-10-CM | POA: Insufficient documentation

## 2017-11-24 DIAGNOSIS — M79642 Pain in left hand: Secondary | ICD-10-CM | POA: Insufficient documentation

## 2017-11-24 DIAGNOSIS — M6281 Muscle weakness (generalized): Secondary | ICD-10-CM | POA: Insufficient documentation
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
## 2018-03-14 ENCOUNTER — Other Ambulatory Visit: Payer: Self-pay

## 2018-03-14 ENCOUNTER — Emergency Department (HOSPITAL_COMMUNITY)
Admission: EM | Admit: 2018-03-14 | Discharge: 2018-03-14 | Disposition: A | Discharge status: Home / Self Care | Payer: Self-pay | Attending: Emergency Medicine | Admitting: Emergency Medicine

## 2018-03-14 ENCOUNTER — Encounter (HOSPITAL_COMMUNITY): Payer: Self-pay | Admitting: Emergency Medicine

## 2018-03-14 DIAGNOSIS — R238 Other skin changes: Secondary | ICD-10-CM | POA: Insufficient documentation

## 2018-03-14 MED ORDER — TERBINAFINE HCL 250 MG PO TABS: 250.0000 mg | ORAL_TABLET | Freq: Every day | ORAL | 0 refills | Status: DC

## 2018-03-14 MED ORDER — HYDROCORTISONE 0.5 % EX CREA: 1.0000 "application " | TOPICAL_CREAM | Freq: Two times a day (BID) | CUTANEOUS | 0 refills | Status: DC

## 2018-03-14 NOTE — ED Notes (Signed)
Ano answer from pt in waiting room.

## 2018-03-14 NOTE — ED Triage Notes (Signed)
Pt. Stated, Clinton Anderson had a hair scalp problem for over 2 months

## 2018-03-14 NOTE — Discharge Instructions (Signed)
Please apply steroid cream to the area.   Please also take lamisil daily for the next four weeks.   Follow up with your regular doctor for recheck.
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
## 2018-03-14 NOTE — ED Notes (Signed)
No answer from pt in waiting room 

## 2018-03-14 NOTE — ED Notes (Signed)
Ship brokerComputer glitch, pt unable to sign d/c  Pt stable, ambulatory, and verbalizes understanding of d/c instructions.

## 2018-07-27 ENCOUNTER — Encounter (HOSPITAL_COMMUNITY): Payer: Self-pay

## 2018-07-27 ENCOUNTER — Emergency Department (HOSPITAL_COMMUNITY)
Admission: EM | Admit: 2018-07-27 | Discharge: 2018-07-27 | Disposition: A | Discharge status: Home / Self Care | Payer: Self-pay | Attending: Emergency Medicine | Admitting: Emergency Medicine

## 2018-07-27 DIAGNOSIS — Z79899 Other long term (current) drug therapy: Secondary | ICD-10-CM | POA: Insufficient documentation

## 2018-07-27 DIAGNOSIS — L409 Psoriasis, unspecified: Secondary | ICD-10-CM | POA: Insufficient documentation

## 2018-07-27 MED ORDER — CLOBETASOL PROPIONATE 0.05 % EX SOLN: 1.0000 "application " | Freq: Two times a day (BID) | CUTANEOUS | 6 refills | Status: DC
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
## 2018-07-27 NOTE — ED Notes (Signed)
Declined W/C at D/C and was escorted to lobby by RN. 

## 2018-07-27 NOTE — ED Triage Notes (Signed)
Pt presents with several areas on his head that are red and itching.  Pt reports areas have been there for 7 years, began as small areas and now are large.

## 2018-07-27 NOTE — Discharge Instructions (Signed)
Use solution as prescribed.  Can also take a Vitamin D supplement. Follow up with Dermatology.

## 2019-01-28 ENCOUNTER — Encounter (HOSPITAL_COMMUNITY): Payer: Self-pay

## 2019-01-28 ENCOUNTER — Other Ambulatory Visit: Payer: Self-pay

## 2019-01-28 ENCOUNTER — Emergency Department (HOSPITAL_COMMUNITY)
Admission: EM | Admit: 2019-01-28 | Discharge: 2019-01-28 | Disposition: A | Discharge status: Home / Self Care | Payer: Self-pay | Attending: Emergency Medicine | Admitting: Emergency Medicine

## 2019-01-28 DIAGNOSIS — R21 Rash and other nonspecific skin eruption: Secondary | ICD-10-CM | POA: Insufficient documentation

## 2019-01-28 MED ORDER — CLOBETASOL PROPIONATE 0.05 % EX SOLN: 1.0000 "application " | Freq: Two times a day (BID) | CUTANEOUS | 2 refills | Status: DC

## 2019-01-28 NOTE — ED Triage Notes (Signed)
Pt here c/o rash to the head along; He states the rash feels like it is raised in certain areas ; he states he has been dealing with this on and off x 3 years ; pt was recently given clobetasol propionate topical soultion recently but hasnt been working
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
## 2019-08-08 ENCOUNTER — Other Ambulatory Visit: Payer: Self-pay

## 2019-08-08 ENCOUNTER — Encounter (HOSPITAL_COMMUNITY): Payer: Self-pay | Admitting: Emergency Medicine

## 2019-08-08 ENCOUNTER — Emergency Department (HOSPITAL_COMMUNITY)
Admission: EM | Admit: 2019-08-08 | Discharge: 2019-08-08 | Disposition: A | Discharge status: Home / Self Care | Payer: Self-pay | Attending: Emergency Medicine | Admitting: Emergency Medicine

## 2019-08-08 DIAGNOSIS — F419 Anxiety disorder, unspecified: Secondary | ICD-10-CM | POA: Insufficient documentation

## 2019-08-08 DIAGNOSIS — Z79899 Other long term (current) drug therapy: Secondary | ICD-10-CM | POA: Insufficient documentation

## 2019-08-08 DIAGNOSIS — K649 Unspecified hemorrhoids: Secondary | ICD-10-CM

## 2019-08-08 NOTE — ED Triage Notes (Signed)
Pt states he has a boil on the outside of his rectum that has been bothering him for at least 5 months but recently he feels like it has gotten bigger. Pt denies any fevers or chills.

## 2019-08-08 NOTE — Discharge Instructions (Addendum)
Use Preparation H hemorrhoidal suppository as directed Try TUCKS pads for rectal pain and itching Try sitz baths for pain Please return if you are not improving
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
## 2020-11-18 ENCOUNTER — Emergency Department (HOSPITAL_COMMUNITY)
Admission: EM | Admit: 2020-11-18 | Discharge: 2020-11-18 | Disposition: A | Discharge status: Home / Self Care | Payer: Self-pay | Attending: Emergency Medicine | Admitting: Emergency Medicine

## 2020-11-18 ENCOUNTER — Emergency Department (HOSPITAL_COMMUNITY): Payer: Self-pay

## 2020-11-18 ENCOUNTER — Encounter (HOSPITAL_COMMUNITY): Payer: Self-pay | Admitting: Emergency Medicine

## 2020-11-18 ENCOUNTER — Other Ambulatory Visit: Payer: Self-pay

## 2020-11-18 DIAGNOSIS — R300 Dysuria: Secondary | ICD-10-CM | POA: Insufficient documentation

## 2020-11-18 DIAGNOSIS — N410 Acute prostatitis: Secondary | ICD-10-CM

## 2020-11-18 DIAGNOSIS — R1030 Lower abdominal pain, unspecified: Secondary | ICD-10-CM | POA: Insufficient documentation

## 2020-11-18 LAB — URINALYSIS, ROUTINE W REFLEX MICROSCOPIC
Bilirubin Urine: NEGATIVE
Glucose, UA: NEGATIVE mg/dL
Hgb urine dipstick: NEGATIVE
Ketones, ur: NEGATIVE mg/dL
Leukocytes,Ua: NEGATIVE
Nitrite: NEGATIVE
Protein, ur: NEGATIVE mg/dL
Specific Gravity, Urine: 1.024 (ref 1.005–1.030)
pH: 5 (ref 5.0–8.0)

## 2020-11-18 LAB — COMPREHENSIVE METABOLIC PANEL
ALT: 34 U/L (ref 0–44)
AST: 28 U/L (ref 15–41)
Albumin: 4 g/dL (ref 3.5–5.0)
Alkaline Phosphatase: 124 U/L (ref 38–126)
Anion gap: 11 (ref 5–15)
BUN: 12 mg/dL (ref 6–20)
CO2: 24 mmol/L (ref 22–32)
Calcium: 9.2 mg/dL (ref 8.9–10.3)
Chloride: 100 mmol/L (ref 98–111)
Creatinine, Ser: 0.99 mg/dL (ref 0.61–1.24)
GFR, Estimated: >60 mL/min (ref >60)
Glucose, Bld: 152 mg/dL — ABNORMAL HIGH (ref 70–99)
Potassium: 3.7 mmol/L (ref 3.5–5.1)
Sodium: 135 mmol/L (ref 135–145)
Total Bilirubin: 0.8 mg/dL (ref 0.3–1.2)
Total Protein: 7.3 g/dL (ref 6.5–8.1)

## 2020-11-18 LAB — CBC
HCT: 45.3 % (ref 39.0–52.0)
Hemoglobin: 15.1 g/dL (ref 13.0–17.0)
MCH: 27.5 pg (ref 26.0–34.0)
MCHC: 33.3 g/dL (ref 30.0–36.0)
MCV: 82.4 fL (ref 80.0–100.0)
Platelets: 216 10*3/uL (ref 150–400)
RBC: 5.5 MIL/uL (ref 4.22–5.81)
RDW: 13.8 % (ref 11.5–15.5)
WBC: 9 10*3/uL (ref 4.0–10.5)
nRBC: 0 % (ref 0.0–0.2)

## 2020-11-18 LAB — LIPASE, BLOOD: Lipase: 21 U/L (ref 11–51)

## 2020-11-18 MED ORDER — CEFTRIAXONE SODIUM 500 MG IJ SOLR
500.0000 mg | Freq: Once | INTRAMUSCULAR | Status: AC
  Administered 2020-11-18: 500 mg via INTRAMUSCULAR
  Filled 2020-11-18: qty 500

## 2020-11-18 MED ORDER — DOXYCYCLINE HYCLATE 100 MG PO CAPS: 100.0000 mg | ORAL_CAPSULE | Freq: Two times a day (BID) | ORAL | 0 refills | Status: DC

## 2020-11-18 MED ORDER — LIDOCAINE HCL (PF) 1 % IJ SOLN
INTRAMUSCULAR | Status: AC
  Administered 2020-11-18: 1 mL
  Filled 2020-11-18: qty 5

## 2020-11-18 NOTE — ED Triage Notes (Signed)
C/o lower abd pain since last night with diarrhea and dysuria.
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
## 2020-11-18 NOTE — Discharge Instructions (Addendum)
The pain in your lower abdomen is likely from a prostate infection called prostatitis.  We gave you a shot to help that and a prescription for antibiotic pills to take.  For the pain, use Tylenol or Motrin.  Follow-up with the doctor of your choice if not better in 3 to 5 days.

## 2022-08-22 DIAGNOSIS — I2510 Atherosclerotic heart disease of native coronary artery without angina pectoris: Secondary | ICD-10-CM

## 2022-08-22 HISTORY — DX: Atherosclerotic heart disease of native coronary artery without angina pectoris: I25.10

## 2022-09-10 ENCOUNTER — Emergency Department (HOSPITAL_COMMUNITY): Payer: Self-pay

## 2022-09-10 ENCOUNTER — Encounter (HOSPITAL_COMMUNITY): Payer: Self-pay | Admitting: Emergency Medicine

## 2022-09-10 ENCOUNTER — Inpatient Hospital Stay (HOSPITAL_COMMUNITY)
Admission: EM | Admit: 2022-09-10 | Discharge: 2022-09-12 | DRG: 247 | Disposition: A | Discharge status: Home / Self Care | Payer: Self-pay | Attending: Cardiovascular Disease | Admitting: Cardiovascular Disease

## 2022-09-10 DIAGNOSIS — I214 Non-ST elevation (NSTEMI) myocardial infarction: Secondary | ICD-10-CM

## 2022-09-10 DIAGNOSIS — Z79899 Other long term (current) drug therapy: Secondary | ICD-10-CM

## 2022-09-10 DIAGNOSIS — I10 Essential (primary) hypertension: Secondary | ICD-10-CM

## 2022-09-10 DIAGNOSIS — Z955 Presence of coronary angioplasty implant and graft: Secondary | ICD-10-CM

## 2022-09-10 DIAGNOSIS — K760 Fatty (change of) liver, not elsewhere classified: Secondary | ICD-10-CM | POA: Diagnosis present

## 2022-09-10 DIAGNOSIS — I25119 Atherosclerotic heart disease of native coronary artery with unspecified angina pectoris: Secondary | ICD-10-CM | POA: Diagnosis present

## 2022-09-10 DIAGNOSIS — E785 Hyperlipidemia, unspecified: Secondary | ICD-10-CM | POA: Diagnosis present

## 2022-09-10 DIAGNOSIS — Z818 Family history of other mental and behavioral disorders: Secondary | ICD-10-CM

## 2022-09-10 DIAGNOSIS — E876 Hypokalemia: Secondary | ICD-10-CM | POA: Diagnosis present

## 2022-09-10 LAB — HEPATIC FUNCTION PANEL
ALT: 39 U/L (ref 0–44)
AST: 40 U/L (ref 15–41)
Albumin: 3.6 g/dL (ref 3.5–5.0)
Alkaline Phosphatase: 95 U/L (ref 38–126)
Bilirubin, Direct: <0.1 mg/dL (ref 0.0–0.2)
Total Bilirubin: 0.9 mg/dL (ref 0.3–1.2)
Total Protein: 6.9 g/dL (ref 6.5–8.1)

## 2022-09-10 LAB — CBC
HCT: 42.9 % (ref 39.0–52.0)
Hemoglobin: 13.7 g/dL (ref 13.0–17.0)
MCH: 25.7 pg — ABNORMAL LOW (ref 26.0–34.0)
MCHC: 31.9 g/dL (ref 30.0–36.0)
MCV: 80.3 fL (ref 80.0–100.0)
Platelets: 195 10*3/uL (ref 150–400)
RBC: 5.34 MIL/uL (ref 4.22–5.81)
RDW: 16.9 % — ABNORMAL HIGH (ref 11.5–15.5)
WBC: 7.5 10*3/uL (ref 4.0–10.5)
nRBC: 0 % (ref 0.0–0.2)

## 2022-09-10 LAB — BASIC METABOLIC PANEL
Anion gap: 10 (ref 5–15)
BUN: 9 mg/dL (ref 6–20)
CO2: 23 mmol/L (ref 22–32)
Calcium: 9.1 mg/dL (ref 8.9–10.3)
Chloride: 105 mmol/L (ref 98–111)
Creatinine, Ser: 0.92 mg/dL (ref 0.61–1.24)
GFR, Estimated: >60 mL/min (ref >60)
Glucose, Bld: 95 mg/dL (ref 70–99)
Potassium: 3.9 mmol/L (ref 3.5–5.1)
Sodium: 138 mmol/L (ref 135–145)

## 2022-09-10 LAB — TROPONIN I (HIGH SENSITIVITY)
Troponin I (High Sensitivity): 513 ng/L (ref <18)
Troponin I (High Sensitivity): 1278 ng/L (ref <18)

## 2022-09-10 MED ORDER — HEPARIN BOLUS VIA INFUSION
4000.0000 [IU] | Freq: Once | INTRAVENOUS | Status: AC
  Administered 2022-09-10: 4000 [IU] via INTRAVENOUS
  Filled 2022-09-10: qty 4000

## 2022-09-10 MED ORDER — ASPIRIN 81 MG PO TBEC: 81.0000 mg | DELAYED_RELEASE_TABLET | Freq: Every day | ORAL | Status: DC

## 2022-09-10 MED ORDER — ACETAMINOPHEN 325 MG PO TABS: 650.0000 mg | ORAL_TABLET | Freq: Four times a day (QID) | ORAL | Status: DC | PRN

## 2022-09-10 MED ORDER — ROSUVASTATIN CALCIUM 20 MG PO TABS
40.0000 mg | ORAL_TABLET | Freq: Every day | ORAL | Status: DC
  Administered 2022-09-11: 40 mg via ORAL
  Filled 2022-09-10: qty 2

## 2022-09-10 MED ORDER — CARVEDILOL 6.25 MG PO TABS
6.2500 mg | ORAL_TABLET | Freq: Two times a day (BID) | ORAL | Status: DC
  Administered 2022-09-10 – 2022-09-12 (×4): 6.25 mg via ORAL
  Filled 2022-09-10: qty 1
  Filled 2022-09-10 (×2): qty 2
  Filled 2022-09-10: qty 1

## 2022-09-10 MED ORDER — IOHEXOL 350 MG/ML SOLN
100.0000 mL | Freq: Once | INTRAVENOUS | Status: AC | PRN
  Administered 2022-09-10: 100 mL via INTRAVENOUS

## 2022-09-10 MED ORDER — ONDANSETRON HCL 4 MG/2ML IJ SOLN: 4.0000 mg | Freq: Four times a day (QID) | INTRAMUSCULAR | Status: DC | PRN

## 2022-09-10 MED ORDER — HEPARIN (PORCINE) 25000 UT/250ML-% IV SOLN
1000.0000 [IU]/h | INTRAVENOUS | Status: DC
  Administered 2022-09-10: 850 [IU]/h via INTRAVENOUS
  Filled 2022-09-10: qty 250

## 2022-09-10 MED ORDER — NITROGLYCERIN 0.4 MG SL SUBL
0.4000 mg | SUBLINGUAL_TABLET | SUBLINGUAL | Status: DC | PRN
  Administered 2022-09-10 (×3): 0.4 mg via SUBLINGUAL
  Filled 2022-09-10: qty 1

## 2022-09-10 MED ORDER — ASPIRIN 325 MG PO TABS
325.0000 mg | ORAL_TABLET | Freq: Once | ORAL | Status: AC
  Administered 2022-09-10: 325 mg via ORAL
  Filled 2022-09-10: qty 1

## 2022-09-10 NOTE — ED Provider Notes (Signed)
[facility] EMERGENCY DEPARTMENT Provider Note   CSN: [id number] Arrival date & time: 09/10/22  1432     History  Chief Complaint  Patient presents with   Chest Pain    [facility] is a 51 y.o. male.  Pt is a 51 yo male with no significant pmhx.  Pt said he developed cp and back pain that started this am.  He denies n/v.  No f/c.  He took a tylenol which helped, but it did not go away completely.  Pt has never had anything like this in the past.  Pt does not smoke.  No family hx.  No hx HLD, HTN, or DM.  Due to language barrier, an interpreter was present during the history-taking and subsequent discussion (and for part of the physical exam) with this patient.        Home Medications Prior to Admission medications   Medication Sig Start Date End Date Taking? Authorizing Provider  clobetasol (TEMOVATE) 0.05 % external solution Apply 1 application topically 2 (two) times daily. 01/28/19   Lacretia Leigh, MD  doxycycline (VIBRAMYCIN) 100 MG capsule Take 1 capsule (100 mg total) by mouth 2 (two) times daily. One po bid x 10 days 11/18/20   Daleen Bo, MD  hydrocortisone cream 0.5 % Apply 1 application topically 2 (two) times daily. 03/14/18   Glyn Ade, PA-C  terbinafine (LAMISIL) 250 MG tablet Take 1 tablet (250 mg total) by mouth daily. 03/14/18   Glyn Ade, PA-C      Allergies    Patient has no known allergies.    Review of Systems   Review of Systems  Cardiovascular:  Positive for chest pain.  Musculoskeletal:  Positive for back pain.  All other systems reviewed and are negative.   Physical Exam Updated Vital Signs BP (!) 160/95   Pulse 76   Temp 98.3 F (36.8 C) (Oral)   Resp (!) 23   Ht 5\' 4"  (1.626 m)   Wt 72.6 kg   SpO2 97%   BMI 27.46 kg/m  Physical Exam Vitals and nursing note reviewed.  Constitutional:      Appearance: He is well-developed.  HENT:     Head: Normocephalic and atraumatic.  Eyes:      Extraocular Movements: Extraocular movements intact.     Pupils: Pupils are equal, round, and reactive to light.  Cardiovascular:     Rate and Rhythm: Normal rate and regular rhythm.     Heart sounds: Normal heart sounds.  Pulmonary:     Effort: Pulmonary effort is normal.     Breath sounds: Normal breath sounds.  Abdominal:     General: Bowel sounds are normal.     Palpations: Abdomen is soft.  Musculoskeletal:        General: Normal range of motion.     Cervical back: Normal range of motion and neck supple.  Skin:    General: Skin is warm.     Capillary Refill: Capillary refill takes less than 2 seconds.  Neurological:     General: No focal deficit present.     Mental Status: He is alert and oriented to person, place, and time.  Psychiatric:        Mood and Affect: Mood normal.        Behavior: Behavior normal.     ED Results / Procedures / Treatments   Labs (all labs ordered are listed, but only abnormal results are displayed) Labs Reviewed  CBC -  Abnormal; Notable for the following components:      Result Value   MCH 25.7 (*)    RDW 16.9 (*)    All other components within normal limits  TROPONIN I (HIGH SENSITIVITY) - Abnormal; Notable for the following components:   Troponin I (High Sensitivity) 513 (*)    All other components within normal limits  TROPONIN I (HIGH SENSITIVITY) - Abnormal; Notable for the following components:   Troponin I (High Sensitivity) 1,278 (*)    All other components within normal limits  BASIC METABOLIC PANEL  LIPID PANEL    EKG EKG Interpretation  Date/Time:  Wednesday September 10 2022 15:42:20 EDT Ventricular Rate:  88 PR Interval:  146 QRS Duration: 80 QT Interval:  342 QTC Calculation: 413 R Axis:   41 Text Interpretation: Normal sinus rhythm Normal ECG No previous ECGs available Confirmed by Jacalyn LefevreHaviland, Ticara Waner [phone]([id number]) on 09/10/2022 5:45:40 PM  Radiology CT Angio Chest/Abd/Pel for Dissection W and/or Wo Contrast  Result Date:  09/10/2022 CLINICAL DATA:  Chest pain, back pain, shortness of breath EXAM: CT ANGIOGRAPHY CHEST, ABDOMEN AND PELVIS TECHNIQUE: Non-contrast CT of the chest was initially obtained. Multidetector CT imaging through the chest, abdomen and pelvis was performed using the standard protocol during bolus administration of intravenous contrast. Multiplanar reconstructed images and MIPs were obtained and reviewed to evaluate the vascular anatomy. RADIATION DOSE REDUCTION: This exam was performed according to the departmental dose-optimization program which includes automated exposure control, adjustment of the mA and/or kV according to patient size and/or use of iterative reconstruction technique. CONTRAST:  100mL OMNIPAQUE IOHEXOL 350 MG/ML SOLN COMPARISON:  09/13/2017 FINDINGS: CTA CHEST FINDINGS Cardiovascular: In the noncontrast images, there is no demonstrable mural hematoma in thoracic aorta. Scattered tiny coronary artery calcifications are seen. There is homogeneous enhancement in thoracic aorta. There is no demonstrable intimal flap. There are no intraluminal filling defects in pulmonary artery branches. Mediastinum/Nodes: No significant lymphadenopathy is seen. Lungs/Pleura: There is no focal pulmonary consolidation. There is no pleural effusion or pneumothorax. Musculoskeletal: Bony structures in the thorax are unremarkable. Review of the MIP images confirms the above findings. CTA ABDOMEN AND PELVIS FINDINGS VASCULAR Aorta: Appears normal Celiac: Appears normal SMA: Appears normal Renals: Appear normal IMA: Patent Inflow: Appears normal Veins: Unremarkable. Review of the MIP images confirms the above findings. NON-VASCULAR Hepatobiliary: There is fatty infiltration. There is no dilation of bile ducts. There is questionable minimal nodularity in the liver surface. Gallbladder is not distended. Pancreas: No focal abnormalities are seen. Spleen: Unremarkable. Adrenals/Urinary Tract: Adrenals are unremarkable. There  is no hydronephrosis. There are no renal or ureteral stones. Urinary bladder is unremarkable. Stomach/Bowel: Stomach is unremarkable. Small bowel loops are not dilated. Appendix is not dilated. Scattered diverticula are seen in colon. There is no focal pericolic inflammation. There is incomplete distention of sigmoid colon. Lymphatic: Unremarkable. Reproductive: Unremarkable. Other: There is no ascites or pneumoperitoneum. Small umbilical hernia containing fat is seen. Left inguinal hernia containing fat is seen. Musculoskeletal: No acute findings are seen. Review of the MIP images confirms the above findings. IMPRESSION: There is no evidence of dissection in thoracic and abdominal aorta. Major branches of thoracic and abdominal aorta appear patent. There is no evidence of pulmonary artery embolism. There is no focal pulmonary consolidation. There is no evidence of intestinal obstruction or pneumoperitoneum. There is no hydronephrosis. Appendix is not dilated. Diverticulosis of colon without signs of focal acute diverticulitis. Fatty liver. Other findings as described in the body of the report. Electronically Signed  By: Ernie Avena M.D.   On: 09/10/2022 21:07   DG Chest 2 View  Result Date: 09/10/2022 CLINICAL DATA:  Chest pain, shortness of breath EXAM: CHEST - 2 VIEW COMPARISON:  11/18/2020 FINDINGS: Transverse diameter of heart is slightly increased. There is poor inspiration. There are no signs of pulmonary edema or focal pulmonary consolidation. There is slight increase in markings in both lower lung fields. There is no pleural effusion or pneumothorax. IMPRESSION: There are no signs of pulmonary edema or focal pulmonary consolidation. Increased markings in both lower lung fields may suggest crowding of bronchovascular structures due to poor inspiration or subsegmental atelectasis. Electronically Signed   By: Ernie Avena M.D.   On: 09/10/2022 16:38    Procedures Procedures     Medications Ordered in ED Medications  nitroGLYCERIN (NITROSTAT) SL tablet 0.4 mg (0.4 mg Sublingual Given 09/10/22 2123)  aspirin tablet 325 mg (325 mg Oral Given 09/10/22 1841)  iohexol (OMNIPAQUE) 350 MG/ML injection 100 mL (100 mLs Intravenous Contrast Given 09/10/22 2053)    ED Course/ Medical Decision Making/ A&P                           Medical Decision Making Amount and/or Complexity of Data Reviewed Labs: ordered. Radiology: ordered.  Risk OTC drugs. Prescription drug management. Decision regarding hospitalization.   This patient presents to the ED for concern of cp, this involves an extensive number of treatment options, and is a complaint that carries with it a high risk of complications and morbidity.  The differential diagnosis includes cardiac, pulm, gi, dissection   Co morbidities that complicate the patient evaluation  none   Additional history obtained:  Additional history obtained from epic chart review   Lab Tests:  I Ordered, and personally interpreted labs.  The pertinent results include:  cbc nl, bmp nl, trop elevated at 513; 2nd trop is 1278.   Imaging Studies ordered:  I ordered imaging studies including CXR and CT chest/abd/pelvis  I independently visualized and interpreted imaging which showed  CXR: IMPRESSION:  There are no signs of pulmonary edema or focal pulmonary  consolidation. Increased markings in both lower lung fields may  suggest crowding of bronchovascular structures due to poor  inspiration or subsegmental atelectasis.  CT dissection: IMPRESSION:  There is no evidence of dissection in thoracic and abdominal aorta.  Major branches of thoracic and abdominal aorta appear patent. There  is no evidence of pulmonary artery embolism.    There is no focal pulmonary consolidation. There is no evidence of  intestinal obstruction or pneumoperitoneum. There is no  hydronephrosis. Appendix is not dilated.    Diverticulosis of  colon without signs of focal acute diverticulitis.  Fatty liver.    Other findings as described in the body of the report.   I agree with the radiologist interpretation   Cardiac Monitoring:  The patient was maintained on a cardiac monitor.  I personally viewed and interpreted the cardiac monitored which showed an underlying rhythm of: nsr   Medicines ordered and prescription drug management:  I ordered medication including asa and nitro  for cp  Reevaluation of the patient after these medicines showed that the patient improved I have reviewed the patients home medicines and have made adjustments as needed   Test Considered:  ct   Critical Interventions:  Asa, nitro, heparin, cards   Consultations Obtained:  I requested consultation with the cardiologist (Dr. Okey Dupre),  and discussed lab and  imaging findings as well as pertinent plan - he will admit   Problem List / ED Course:  NSTEMI:  no evidence of dissection.  Pt is started on heparin.  Cards will admit.    Reevaluation:  After the interventions noted above, I reevaluated the patient and found that they have :improved   Social Determinants of Health:  Spanish speaker; no pcp; no insurance   Dispostion:  After consideration of the diagnostic results and the patients response to treatment, I feel that the patent would benefit from admission.    CRITICAL CARE Performed by: Jacalyn Lefevre   Total critical care time: 30 minutes  Critical care time was exclusive of separately billable procedures and treating other patients.  Critical care was necessary to treat or prevent imminent or life-threatening deterioration.  Critical care was time spent personally by me on the following activities: development of treatment plan with patient and/or surrogate as well as nursing, discussions with consultants, evaluation of patient's response to treatment, examination of patient, obtaining history from patient or  surrogate, ordering and performing treatments and interventions, ordering and review of laboratory studies, ordering and review of radiographic studies, pulse oximetry and re-evaluation of patient's condition.         Final Clinical Impression(s) / ED Diagnoses Final diagnoses:  NSTEMI (non-ST elevated myocardial infarction) [facility])    Rx / DC Orders ED Discharge Orders     None         Jacalyn Lefevre, MD 09/10/22 2127

## 2022-09-10 NOTE — ED Provider Triage Note (Signed)
Emergency Medicine Provider Triage Evaluation Note  [facility] , a 51 y.o. male  was evaluated in triage.  Pt complains of cp. Pain to chest and back with sob which started this AM.  No fever, cough, n/v/d, dizzy, lighthead.  No tobacco use, yes alcohol use.   Review of Systems  Positive: As above Negative: As above  Physical Exam  BP (!) 166/113   Pulse 94   Temp 98.5 F (36.9 C) (Oral)   Resp 16   SpO2 100%  Gen:   Awake, no distress   Resp:  Normal effort  MSK:   Moves extremities without difficulty  Other:    Medical Decision Making  Medically screening exam initiated at 3:55 PM.  Appropriate orders placed.  Bassem Bernasconi was informed that the remainder of the evaluation will be completed by another provider, this initial triage assessment does not replace that evaluation, and the importance of remaining in the ED until their evaluation is complete.     Domenic Moras, PA-C 09/10/22 1557
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
## 2022-09-10 NOTE — ED Notes (Signed)
Cp feels much better after ntg x3. Reports 4 or 5. Resting.

## 2022-09-10 NOTE — ED Triage Notes (Signed)
Patient complains of chest pain and shortness of breath while walking that started this morning. Patient reports improvement in pain after taking tylenol earlier today. Patient is alert, oriented, ambulating independently with steady gait.
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
## 2022-09-10 NOTE — ED Notes (Signed)
Made aware of need for urine.

## 2022-09-11 ENCOUNTER — Inpatient Hospital Stay (HOSPITAL_COMMUNITY): Payer: Self-pay

## 2022-09-11 ENCOUNTER — Inpatient Hospital Stay (HOSPITAL_COMMUNITY)
Admission: EM | Disposition: A | Discharge status: Home / Self Care | Payer: Self-pay | Source: Home / Self Care | Attending: Cardiovascular Disease

## 2022-09-11 ENCOUNTER — Encounter (HOSPITAL_COMMUNITY): Payer: Self-pay | Admitting: Cardiovascular Disease

## 2022-09-11 ENCOUNTER — Other Ambulatory Visit: Payer: Self-pay

## 2022-09-11 DIAGNOSIS — I214 Non-ST elevation (NSTEMI) myocardial infarction: Secondary | ICD-10-CM

## 2022-09-11 DIAGNOSIS — I10 Essential (primary) hypertension: Secondary | ICD-10-CM

## 2022-09-11 DIAGNOSIS — I2510 Atherosclerotic heart disease of native coronary artery without angina pectoris: Secondary | ICD-10-CM

## 2022-09-11 HISTORY — PX: CORONARY STENT INTERVENTION: CATH118234

## 2022-09-11 HISTORY — PX: LEFT HEART CATH AND CORONARY ANGIOGRAPHY: CATH118249

## 2022-09-11 LAB — POCT ACTIVATED CLOTTING TIME
Activated Clotting Time: 263 seconds
Activated Clotting Time: 299 seconds
Activated Clotting Time: 227 seconds
Activated Clotting Time: 173 seconds

## 2022-09-11 LAB — ECHOCARDIOGRAM COMPLETE
AR max vel: 2.77 cm2
AV Area VTI: 2.96 cm2
AV Area mean vel: 2.74 cm2
AV Mean grad: 2 mmHg
AV Peak grad: 4.5 mmHg
Ao pk vel: 1.06 m/s
Area-P 1/2: 4.31 cm2
Height: 64 in
S' Lateral: 2.9 cm
Weight: 2473.6 oz

## 2022-09-11 LAB — CBC
HCT: 42.2 % (ref 39.0–52.0)
Hemoglobin: 14 g/dL (ref 13.0–17.0)
MCH: 26.4 pg (ref 26.0–34.0)
MCHC: 33.2 g/dL (ref 30.0–36.0)
MCV: 79.5 fL — ABNORMAL LOW (ref 80.0–100.0)
Platelets: 190 10*3/uL (ref 150–400)
RBC: 5.31 MIL/uL (ref 4.22–5.81)
RDW: 16.9 % — ABNORMAL HIGH (ref 11.5–15.5)
WBC: 5.9 10*3/uL (ref 4.0–10.5)
nRBC: 0 % (ref 0.0–0.2)

## 2022-09-11 LAB — HEMOGLOBIN A1C
Hgb A1c MFr Bld: 5.6 % (ref 4.8–5.6)
Mean Plasma Glucose: 114.02 mg/dL

## 2022-09-11 LAB — RAPID URINE DRUG SCREEN, HOSP PERFORMED
Amphetamines: NOT DETECTED
Barbiturates: NOT DETECTED
Benzodiazepines: NOT DETECTED
Cocaine: NOT DETECTED
Opiates: NOT DETECTED
Tetrahydrocannabinol: NOT DETECTED

## 2022-09-11 LAB — TSH: TSH: 2.343 u[IU]/mL (ref 0.350–4.500)

## 2022-09-11 LAB — HEPARIN LEVEL (UNFRACTIONATED): Heparin Unfractionated: 0.1 IU/mL — ABNORMAL LOW (ref 0.30–0.70)

## 2022-09-11 LAB — LIPID PANEL
Cholesterol: 249 mg/dL — ABNORMAL HIGH (ref 0–200)
HDL: 45 mg/dL (ref >40)
LDL Cholesterol: 156 mg/dL — ABNORMAL HIGH (ref 0–99)
Total CHOL/HDL Ratio: 5.5 RATIO
Triglycerides: 239 mg/dL — ABNORMAL HIGH (ref <150)
VLDL: 48 mg/dL — ABNORMAL HIGH (ref 0–40)

## 2022-09-11 LAB — TROPONIN I (HIGH SENSITIVITY)
Troponin I (High Sensitivity): 3176 ng/L (ref <18)
Troponin I (High Sensitivity): 2533 ng/L (ref <18)

## 2022-09-11 LAB — HIV ANTIBODY (ROUTINE TESTING W REFLEX): HIV Screen 4th Generation wRfx: NONREACTIVE

## 2022-09-11 LAB — BRAIN NATRIURETIC PEPTIDE: B Natriuretic Peptide: 88.1 pg/mL (ref 0.0–100.0)

## 2022-09-11 SURGERY — LEFT HEART CATH AND CORONARY ANGIOGRAPHY
Anesthesia: LOCAL

## 2022-09-11 MED ORDER — SODIUM CHLORIDE 0.9 % IV SOLN: 250.0000 mL | INTRAVENOUS | Status: DC | PRN

## 2022-09-11 MED ORDER — ASPIRIN 81 MG PO TBEC
81.0000 mg | DELAYED_RELEASE_TABLET | Freq: Every day | ORAL | Status: DC
  Administered 2022-09-12: 81 mg via ORAL
  Filled 2022-09-11: qty 1

## 2022-09-11 MED ORDER — HYDRALAZINE HCL 20 MG/ML IJ SOLN: 10.0000 mg | INTRAMUSCULAR | Status: AC | PRN

## 2022-09-11 MED ORDER — SODIUM CHLORIDE 0.9% FLUSH: 3.0000 mL | INTRAVENOUS | Status: DC | PRN

## 2022-09-11 MED ORDER — ASPIRIN 81 MG PO CHEW
81.0000 mg | CHEWABLE_TABLET | ORAL | Status: AC
  Administered 2022-09-11: 81 mg via ORAL
  Filled 2022-09-11: qty 1

## 2022-09-11 MED ORDER — HEPARIN SODIUM (PORCINE) 1000 UNIT/ML IJ SOLN
INTRAMUSCULAR | Status: DC | PRN
  Administered 2022-09-11 (×2): 5000 [IU] via INTRAVENOUS

## 2022-09-11 MED ORDER — SODIUM CHLORIDE 0.9 % WEIGHT BASED INFUSION: 3.0000 mL/kg/h | INTRAVENOUS | Status: DC

## 2022-09-11 MED ORDER — IOHEXOL 350 MG/ML SOLN
INTRAVENOUS | Status: DC | PRN
  Administered 2022-09-11: 144 mL

## 2022-09-11 MED ORDER — NITROGLYCERIN 1 MG/10 ML FOR IR/CATH LAB
INTRA_ARTERIAL | Status: AC
  Filled 2022-09-11: qty 10

## 2022-09-11 MED ORDER — LIDOCAINE HCL (PF) 1 % IJ SOLN
INTRAMUSCULAR | Status: DC | PRN
  Administered 2022-09-11: 2 mL
  Administered 2022-09-11: 15 mL

## 2022-09-11 MED ORDER — SODIUM CHLORIDE 0.9% FLUSH
3.0000 mL | Freq: Two times a day (BID) | INTRAVENOUS | Status: DC
  Administered 2022-09-11 – 2022-09-12 (×2): 3 mL via INTRAVENOUS

## 2022-09-11 MED ORDER — SODIUM CHLORIDE 0.9% FLUSH: 3.0000 mL | Freq: Two times a day (BID) | INTRAVENOUS | Status: DC

## 2022-09-11 MED ORDER — HEPARIN BOLUS VIA INFUSION
2000.0000 [IU] | Freq: Once | INTRAVENOUS | Status: AC
  Administered 2022-09-11: 2000 [IU] via INTRAVENOUS
  Filled 2022-09-11: qty 2000

## 2022-09-11 MED ORDER — HEPARIN (PORCINE) IN NACL 1000-0.9 UT/500ML-% IV SOLN
INTRAVENOUS | Status: DC | PRN
  Administered 2022-09-11 (×2): 500 mL

## 2022-09-11 MED ORDER — MIDAZOLAM HCL 2 MG/2ML IJ SOLN
INTRAMUSCULAR | Status: DC | PRN
  Administered 2022-09-11: 1 mg via INTRAVENOUS
  Administered 2022-09-11: 2 mg via INTRAVENOUS
  Administered 2022-09-11: 1 mg via INTRAVENOUS

## 2022-09-11 MED ORDER — LABETALOL HCL 5 MG/ML IV SOLN: 10.0000 mg | INTRAVENOUS | Status: AC | PRN

## 2022-09-11 MED ORDER — TICAGRELOR 90 MG PO TABS
ORAL_TABLET | ORAL | Status: DC | PRN
  Administered 2022-09-11: 180 mg via ORAL

## 2022-09-11 MED ORDER — SODIUM CHLORIDE 0.9 % WEIGHT BASED INFUSION
1.0000 mL/kg/h | INTRAVENOUS | Status: DC
  Administered 2022-09-11: 1 mL/kg/h via INTRAVENOUS

## 2022-09-11 MED ORDER — ATORVASTATIN CALCIUM 80 MG PO TABS
80.0000 mg | ORAL_TABLET | Freq: Every day | ORAL | Status: DC
  Administered 2022-09-11 – 2022-09-12 (×2): 80 mg via ORAL
  Filled 2022-09-11 (×2): qty 1

## 2022-09-11 MED ORDER — SODIUM CHLORIDE 0.9 % IV SOLN: INTRAVENOUS | Status: AC

## 2022-09-11 MED ORDER — FENTANYL CITRATE (PF) 100 MCG/2ML IJ SOLN
INTRAMUSCULAR | Status: DC | PRN
  Administered 2022-09-11: 25 ug via INTRAVENOUS
  Administered 2022-09-11: 50 ug via INTRAVENOUS
  Administered 2022-09-11: 25 ug via INTRAVENOUS

## 2022-09-11 MED ORDER — TICAGRELOR 90 MG PO TABS
90.0000 mg | ORAL_TABLET | Freq: Two times a day (BID) | ORAL | Status: DC
  Administered 2022-09-11 – 2022-09-12 (×2): 90 mg via ORAL
  Filled 2022-09-11 (×2): qty 1

## 2022-09-11 SURGICAL SUPPLY — 25 items
BALL SAPPHIRE NC24 3.25X10 (BALLOONS) ×1
BALLN SAPPHIRE 2.0X12 (BALLOONS) ×1
BALLOON SAPPHIRE 2.0X12 (BALLOONS) IMPLANT
BALLOON SAPPHIRE NC24 3.25X10 (BALLOONS) IMPLANT
CATH 5FR JL3.5 JR4 ANG PIG MP (CATHETERS) IMPLANT
CATH INFINITI 5 FR AR1 MOD (CATHETERS) IMPLANT
CATH INFINITI 5FR JL4 (CATHETERS) IMPLANT
CATH LAUNCHER 6FR EBU3.5 (CATHETERS) IMPLANT
DEVICE RAD COMP TR BAND LRG (VASCULAR PRODUCTS) IMPLANT
GLIDESHEATH SLEND SS 6F .021 (SHEATH) IMPLANT
GUIDEWIRE INQWIRE 1.5J.035X260 (WIRE) IMPLANT
INQWIRE 1.5J .035X260CM (WIRE) ×1
KIT ENCORE 26 ADVANTAGE (KITS) IMPLANT
KIT HEART LEFT (KITS) ×1 IMPLANT
KIT MICROPUNCTURE NIT STIFF (SHEATH) IMPLANT
PACK CARDIAC CATHETERIZATION (CUSTOM PROCEDURE TRAY) ×1 IMPLANT
SHEATH 6FR 85 DEST SLENDER (SHEATH) IMPLANT
SHEATH PINNACLE 5F 10CM (SHEATH) IMPLANT
SHEATH PINNACLE 6F 10CM (SHEATH) IMPLANT
SHEATH PROBE COVER 6X72 (BAG) IMPLANT
STENT SYNERGY XD 3.0X16 (Permanent Stent) IMPLANT
SYNERGY XD 3.0X16 (Permanent Stent) ×1 IMPLANT
TUBING CIL FLEX 10 FLL-RA (TUBING) ×1 IMPLANT
WIRE COUGAR XT STRL 190CM (WIRE) IMPLANT
WIRE EMERALD 3MM-J .035X150CM (WIRE) IMPLANT

## 2022-09-11 NOTE — Progress Notes (Signed)
Rounding Note    Patient Name: Clinton Anderson Date of Encounter: 09/11/2022  [facility] Cardiologist: None New to Dr. Rennis Golden  Subjective   No recurrent pain.   Inpatient Medications    Scheduled Meds:  aspirin  81 mg Oral Pre-Cath   [START ON 09/12/2022] aspirin EC  81 mg Oral Daily   carvedilol  6.25 mg Oral BID WC   rosuvastatin  40 mg Oral Daily   Continuous Infusions:  sodium chloride     Followed by   sodium chloride     heparin 1,000 Units/hr (09/11/22 0414)   PRN Meds: acetaminophen, nitroGLYCERIN, ondansetron (ZOFRAN) IV   Vital Signs    Vitals:   09/11/22 0530 09/11/22 0600 09/11/22 0630 09/11/22 0700  BP: 128/89 117/72 104/78 115/68  Pulse: 66 69 75 72  Resp: (!) 22 13 18 15   Temp:      TempSrc:      SpO2: 96% 98% 91% 94%  Weight:      Height:       No intake or output data in the 24 hours ending 09/11/22 0848    09/10/2022    9:00 PM 01/28/2019   10:08 AM 03/14/2018   11:32 AM  Last 3 Weights  Weight (lbs) 160 lb  150 lb  Weight (kg) 72.576 kg  68.04 kg     Information is confidential and restricted. Go to Review Flowsheets to unlock data.      Telemetry    NSR - Personally Reviewed  ECG    SR - Personally Reviewed  Physical Exam   GEN: No acute distress.   Neck: No JVD Cardiac: RRR, no murmurs, rubs, or gallops.  Respiratory: Clear to auscultation bilaterally. GI: Soft, nontender, non-distended  MS: No edema; No deformity. Neuro:  Nonfocal  Psych: Normal affect   Labs    High Sensitivity Troponin:   Recent Labs  Lab 09/10/22 1558 09/10/22 1832 09/10/22 2300 09/11/22 0327  TROPONINIHS 513* 1,278* 3,176* 2,533*     Chemistry Recent Labs  Lab 09/10/22 1558 09/10/22 2130  NA 138  --   K 3.9  --   CL 105  --   CO2 23  --   GLUCOSE 95  --   BUN 9  --   CREATININE 0.92  --   CALCIUM 9.1  --   PROT  --  6.9  ALBUMIN  --  3.6  AST  --  40  ALT  --  39  ALKPHOS  --  95  BILITOT  --  0.9  GFRNONAA  >60  --   ANIONGAP 10  --     Lipids  Recent Labs  Lab 09/11/22 0327  CHOL 249*  TRIG 239*  HDL 45  LDLCALC 156*  CHOLHDL 5.5    Hematology Recent Labs  Lab 09/10/22 1558 09/11/22 0327  WBC 7.5 5.9  RBC 5.34 5.31  HGB 13.7 14.0  HCT 42.9 42.2  MCV 80.3 79.5*  MCH 25.7* 26.4  MCHC 31.9 33.2  RDW 16.9* 16.9*  PLT 195 190   Thyroid  Recent Labs  Lab 09/11/22 0327  TSH 2.343    BNP Recent Labs  Lab 09/11/22 0327  BNP 88.1    DDimer No results for input(s): "DDIMER" in the last 168 hours.   Radiology    CT Angio Chest/Abd/Pel for Dissection W and/or Wo Contrast  Result Date: 09/10/2022 CLINICAL DATA:  Chest pain, back pain, shortness of breath EXAM: CT ANGIOGRAPHY CHEST,  ABDOMEN AND PELVIS TECHNIQUE: Non-contrast CT of the chest was initially obtained. Multidetector CT imaging through the chest, abdomen and pelvis was performed using the standard protocol during bolus administration of intravenous contrast. Multiplanar reconstructed images and MIPs were obtained and reviewed to evaluate the vascular anatomy. RADIATION DOSE REDUCTION: This exam was performed according to the departmental dose-optimization program which includes automated exposure control, adjustment of the mA and/or kV according to patient size and/or use of iterative reconstruction technique. CONTRAST:  171mL OMNIPAQUE IOHEXOL 350 MG/ML SOLN COMPARISON:  09/13/2017 FINDINGS: CTA CHEST FINDINGS Cardiovascular: In the noncontrast images, there is no demonstrable mural hematoma in thoracic aorta. Scattered tiny coronary artery calcifications are seen. There is homogeneous enhancement in thoracic aorta. There is no demonstrable intimal flap. There are no intraluminal filling defects in pulmonary artery branches. Mediastinum/Nodes: No significant lymphadenopathy is seen. Lungs/Pleura: There is no focal pulmonary consolidation. There is no pleural effusion or pneumothorax. Musculoskeletal: Bony structures in the  thorax are unremarkable. Review of the MIP images confirms the above findings. CTA ABDOMEN AND PELVIS FINDINGS VASCULAR Aorta: Appears normal Celiac: Appears normal SMA: Appears normal Renals: Appear normal IMA: Patent Inflow: Appears normal Veins: Unremarkable. Review of the MIP images confirms the above findings. NON-VASCULAR Hepatobiliary: There is fatty infiltration. There is no dilation of bile ducts. There is questionable minimal nodularity in the liver surface. Gallbladder is not distended. Pancreas: No focal abnormalities are seen. Spleen: Unremarkable. Adrenals/Urinary Tract: Adrenals are unremarkable. There is no hydronephrosis. There are no renal or ureteral stones. Urinary bladder is unremarkable. Stomach/Bowel: Stomach is unremarkable. Small bowel loops are not dilated. Appendix is not dilated. Scattered diverticula are seen in colon. There is no focal pericolic inflammation. There is incomplete distention of sigmoid colon. Lymphatic: Unremarkable. Reproductive: Unremarkable. Other: There is no ascites or pneumoperitoneum. Small umbilical hernia containing fat is seen. Left inguinal hernia containing fat is seen. Musculoskeletal: No acute findings are seen. Review of the MIP images confirms the above findings. IMPRESSION: There is no evidence of dissection in thoracic and abdominal aorta. Major branches of thoracic and abdominal aorta appear patent. There is no evidence of pulmonary artery embolism. There is no focal pulmonary consolidation. There is no evidence of intestinal obstruction or pneumoperitoneum. There is no hydronephrosis. Appendix is not dilated. Diverticulosis of colon without signs of focal acute diverticulitis. Fatty liver. Other findings as described in the body of the report. Electronically Signed   By: Elmer Picker M.D.   On: 09/10/2022 21:07   DG Chest 2 View  Result Date: 09/10/2022 CLINICAL DATA:  Chest pain, shortness of breath EXAM: CHEST - 2 VIEW COMPARISON:   11/18/2020 FINDINGS: Transverse diameter of heart is slightly increased. There is poor inspiration. There are no signs of pulmonary edema or focal pulmonary consolidation. There is slight increase in markings in both lower lung fields. There is no pleural effusion or pneumothorax. IMPRESSION: There are no signs of pulmonary edema or focal pulmonary consolidation. Increased markings in both lower lung fields may suggest crowding of bronchovascular structures due to poor inspiration or subsegmental atelectasis. Electronically Signed   By: Elmer Picker M.D.   On: 09/10/2022 16:38    Cardiac Studies   Pending echo and cath   Patient Profile     51 y.o. male with a history of alcohol abuse but otherwise no known medical history who presented with exertional chest pain and was found to have a significantly elevated troponin.  Assessment & Plan    NSTEMI - Chest pain  free after SL nitro x 3 & heparin. No dissection on CT of chest.  - Troponin peaked at 3176, now trending down.  - UDS clear - Pending echo - The patient understands that risks include but are not limited to stroke (1 in 1000), death (1 in 1000), kidney failure [usually temporary] (1 in 500), bleeding (1 in 200), allergic reaction [possibly serious] (1 in 200), and agrees to proceed.   Language line used for interpretation. - Continue heparin, ASA, BB and statin  - 09/11/2022: Cholesterol 249; HDL 45; LDL Cholesterol 156; Triglycerides 239; VLDL 48   2. Elevated Blood pressure - BP now normalized on BB    For questions or updates, please contact [facility] Please consult [email or web address] for contact info under        SignedManson Passey, PA  09/11/2022, 8:48 AM

## 2022-09-11 NOTE — Interval H&P Note (Signed)
History and Physical Interval Note:  09/11/2022 1:58 PM  Clinton Anderson  has presented today for surgery, with the diagnosis of nstemi.  The various methods of treatment have been discussed with the patient and family. After consideration of risks, benefits and other options for treatment, the patient has consented to  Procedure(s): LEFT HEART CATH AND CORONARY ANGIOGRAPHY (N/A) as a surgical intervention.  The patient's history has been reviewed, patient examined, no change in status, stable for surgery.  I have reviewed the patient's chart and labs.  Questions were answered to the patient's satisfaction.    Cath Lab Visit (complete for each Cath Lab visit)  Clinical Evaluation Leading to the Procedure:   ACS: Yes.    Non-ACS:    Anginal Classification: CCS III  Anti-ischemic medical therapy: No Therapy  Non-Invasive Test Results: No non-invasive testing performed  Prior CABG: No previous CABG        Lauree Chandler

## 2022-09-11 NOTE — Progress Notes (Signed)
Site area: rt groin Site Prior to Removal:  Level 0 Pressure Applied For: 25 minutes Manual:   yes Patient Status During Pull:  stable Post Pull Site:  Level 0 Post Pull Instructions Given:  yes Post Pull Pulses Present: rt dp palpable Dressing Applied:  gauze and tegaderm Bedrest begins @ 1657 Comments:
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
## 2022-09-11 NOTE — H&P (View-Only) (Signed)
Rounding Note    Patient Name: Clinton Anderson Date of Encounter: 09/11/2022  [facility] Cardiologist: None New to Dr. Rennis Golden  Subjective   No recurrent pain.   Inpatient Medications    Scheduled Meds:  aspirin  81 mg Oral Pre-Cath   [START ON 09/12/2022] aspirin EC  81 mg Oral Daily   carvedilol  6.25 mg Oral BID WC   rosuvastatin  40 mg Oral Daily   Continuous Infusions:  sodium chloride     Followed by   sodium chloride     heparin 1,000 Units/hr (09/11/22 0414)   PRN Meds: acetaminophen, nitroGLYCERIN, ondansetron (ZOFRAN) IV   Vital Signs    Vitals:   09/11/22 0530 09/11/22 0600 09/11/22 0630 09/11/22 0700  BP: 128/89 117/72 104/78 115/68  Pulse: 66 69 75 72  Resp: (!) 22 13 18 15   Temp:      TempSrc:      SpO2: 96% 98% 91% 94%  Weight:      Height:       No intake or output data in the 24 hours ending 09/11/22 0848    09/10/2022    9:00 PM 01/28/2019   10:08 AM 03/14/2018   11:32 AM  Last 3 Weights  Weight (lbs) 160 lb  150 lb  Weight (kg) 72.576 kg  68.04 kg     Information is confidential and restricted. Go to Review Flowsheets to unlock data.      Telemetry    NSR - Personally Reviewed  ECG    SR - Personally Reviewed  Physical Exam   GEN: No acute distress.   Neck: No JVD Cardiac: RRR, no murmurs, rubs, or gallops.  Respiratory: Clear to auscultation bilaterally. GI: Soft, nontender, non-distended  MS: No edema; No deformity. Neuro:  Nonfocal  Psych: Normal affect   Labs    High Sensitivity Troponin:   Recent Labs  Lab 09/10/22 1558 09/10/22 1832 09/10/22 2300 09/11/22 0327  TROPONINIHS 513* 1,278* 3,176* 2,533*     Chemistry Recent Labs  Lab 09/10/22 1558 09/10/22 2130  NA 138  --   K 3.9  --   CL 105  --   CO2 23  --   GLUCOSE 95  --   BUN 9  --   CREATININE 0.92  --   CALCIUM 9.1  --   PROT  --  6.9  ALBUMIN  --  3.6  AST  --  40  ALT  --  39  ALKPHOS  --  95  BILITOT  --  0.9  GFRNONAA  >60  --   ANIONGAP 10  --     Lipids  Recent Labs  Lab 09/11/22 0327  CHOL 249*  TRIG 239*  HDL 45  LDLCALC 156*  CHOLHDL 5.5    Hematology Recent Labs  Lab 09/10/22 1558 09/11/22 0327  WBC 7.5 5.9  RBC 5.34 5.31  HGB 13.7 14.0  HCT 42.9 42.2  MCV 80.3 79.5*  MCH 25.7* 26.4  MCHC 31.9 33.2  RDW 16.9* 16.9*  PLT 195 190   Thyroid  Recent Labs  Lab 09/11/22 0327  TSH 2.343    BNP Recent Labs  Lab 09/11/22 0327  BNP 88.1    DDimer No results for input(s): "DDIMER" in the last 168 hours.   Radiology    CT Angio Chest/Abd/Pel for Dissection W and/or Wo Contrast  Result Date: 09/10/2022 CLINICAL DATA:  Chest pain, back pain, shortness of breath EXAM: CT ANGIOGRAPHY CHEST,  ABDOMEN AND PELVIS TECHNIQUE: Non-contrast CT of the chest was initially obtained. Multidetector CT imaging through the chest, abdomen and pelvis was performed using the standard protocol during bolus administration of intravenous contrast. Multiplanar reconstructed images and MIPs were obtained and reviewed to evaluate the vascular anatomy. RADIATION DOSE REDUCTION: This exam was performed according to the departmental dose-optimization program which includes automated exposure control, adjustment of the mA and/or kV according to patient size and/or use of iterative reconstruction technique. CONTRAST:  171mL OMNIPAQUE IOHEXOL 350 MG/ML SOLN COMPARISON:  09/13/2017 FINDINGS: CTA CHEST FINDINGS Cardiovascular: In the noncontrast images, there is no demonstrable mural hematoma in thoracic aorta. Scattered tiny coronary artery calcifications are seen. There is homogeneous enhancement in thoracic aorta. There is no demonstrable intimal flap. There are no intraluminal filling defects in pulmonary artery branches. Mediastinum/Nodes: No significant lymphadenopathy is seen. Lungs/Pleura: There is no focal pulmonary consolidation. There is no pleural effusion or pneumothorax. Musculoskeletal: Bony structures in the  thorax are unremarkable. Review of the MIP images confirms the above findings. CTA ABDOMEN AND PELVIS FINDINGS VASCULAR Aorta: Appears normal Celiac: Appears normal SMA: Appears normal Renals: Appear normal IMA: Patent Inflow: Appears normal Veins: Unremarkable. Review of the MIP images confirms the above findings. NON-VASCULAR Hepatobiliary: There is fatty infiltration. There is no dilation of bile ducts. There is questionable minimal nodularity in the liver surface. Gallbladder is not distended. Pancreas: No focal abnormalities are seen. Spleen: Unremarkable. Adrenals/Urinary Tract: Adrenals are unremarkable. There is no hydronephrosis. There are no renal or ureteral stones. Urinary bladder is unremarkable. Stomach/Bowel: Stomach is unremarkable. Small bowel loops are not dilated. Appendix is not dilated. Scattered diverticula are seen in colon. There is no focal pericolic inflammation. There is incomplete distention of sigmoid colon. Lymphatic: Unremarkable. Reproductive: Unremarkable. Other: There is no ascites or pneumoperitoneum. Small umbilical hernia containing fat is seen. Left inguinal hernia containing fat is seen. Musculoskeletal: No acute findings are seen. Review of the MIP images confirms the above findings. IMPRESSION: There is no evidence of dissection in thoracic and abdominal aorta. Major branches of thoracic and abdominal aorta appear patent. There is no evidence of pulmonary artery embolism. There is no focal pulmonary consolidation. There is no evidence of intestinal obstruction or pneumoperitoneum. There is no hydronephrosis. Appendix is not dilated. Diverticulosis of colon without signs of focal acute diverticulitis. Fatty liver. Other findings as described in the body of the report. Electronically Signed   By: Elmer Picker M.D.   On: 09/10/2022 21:07   DG Chest 2 View  Result Date: 09/10/2022 CLINICAL DATA:  Chest pain, shortness of breath EXAM: CHEST - 2 VIEW COMPARISON:   11/18/2020 FINDINGS: Transverse diameter of heart is slightly increased. There is poor inspiration. There are no signs of pulmonary edema or focal pulmonary consolidation. There is slight increase in markings in both lower lung fields. There is no pleural effusion or pneumothorax. IMPRESSION: There are no signs of pulmonary edema or focal pulmonary consolidation. Increased markings in both lower lung fields may suggest crowding of bronchovascular structures due to poor inspiration or subsegmental atelectasis. Electronically Signed   By: Elmer Picker M.D.   On: 09/10/2022 16:38    Cardiac Studies   Pending echo and cath   Patient Profile     51 y.o. male with a history of alcohol abuse but otherwise no known medical history who presented with exertional chest pain and was found to have a significantly elevated troponin.  Assessment & Plan    NSTEMI - Chest pain  free after SL nitro x 3 & heparin. No dissection on CT of chest.  - Troponin peaked at 3176, now trending down.  - UDS clear - Pending echo - The patient understands that risks include but are not limited to stroke (1 in 1000), death (1 in 1000), kidney failure [usually temporary] (1 in 500), bleeding (1 in 200), allergic reaction [possibly serious] (1 in 200), and agrees to proceed.   Language line used for interpretation. - Continue heparin, ASA, BB and statin  - 09/11/2022: Cholesterol 249; HDL 45; LDL Cholesterol 156; Triglycerides 239; VLDL 48   2. Elevated Blood pressure - BP now normalized on BB    For questions or updates, please contact [facility] Please consult [email or web address] for contact info under        SignedManson Passey, PA  09/11/2022, 8:48 AM

## 2022-09-12 ENCOUNTER — Telehealth: Payer: Self-pay | Admitting: Physician Assistant

## 2022-09-12 ENCOUNTER — Encounter (HOSPITAL_COMMUNITY): Payer: Self-pay | Admitting: Cardiovascular Disease

## 2022-09-12 ENCOUNTER — Other Ambulatory Visit (HOSPITAL_COMMUNITY): Payer: Self-pay

## 2022-09-12 DIAGNOSIS — E785 Hyperlipidemia, unspecified: Secondary | ICD-10-CM

## 2022-09-12 LAB — CBC
HCT: 39.2 % (ref 39.0–52.0)
Hemoglobin: 13.1 g/dL (ref 13.0–17.0)
MCH: 26.3 pg (ref 26.0–34.0)
MCHC: 33.4 g/dL (ref 30.0–36.0)
MCV: 78.6 fL — ABNORMAL LOW (ref 80.0–100.0)
Platelets: 191 10*3/uL (ref 150–400)
RBC: 4.99 MIL/uL (ref 4.22–5.81)
RDW: 17 % — ABNORMAL HIGH (ref 11.5–15.5)
WBC: 7.4 10*3/uL (ref 4.0–10.5)
nRBC: 0 % (ref 0.0–0.2)

## 2022-09-12 LAB — BASIC METABOLIC PANEL
Anion gap: 7 (ref 5–15)
BUN: 13 mg/dL (ref 6–20)
CO2: 25 mmol/L (ref 22–32)
Calcium: 9 mg/dL (ref 8.9–10.3)
Chloride: 103 mmol/L (ref 98–111)
Creatinine, Ser: 0.76 mg/dL (ref 0.61–1.24)
GFR, Estimated: >60 mL/min (ref >60)
Glucose, Bld: 104 mg/dL — ABNORMAL HIGH (ref 70–99)
Potassium: 3.4 mmol/L — ABNORMAL LOW (ref 3.5–5.1)
Sodium: 135 mmol/L (ref 135–145)

## 2022-09-12 LAB — LIPOPROTEIN A (LPA): Lipoprotein (a): 21.5 nmol/L (ref <75.0)

## 2022-09-12 MED ORDER — ASPIRIN 81 MG PO TBEC
81.0000 mg | DELAYED_RELEASE_TABLET | Freq: Every day | ORAL | 3 refills | Status: DC
  Filled 2022-09-12: qty 30, 30d supply, fill #0

## 2022-09-12 MED ORDER — ATORVASTATIN CALCIUM 80 MG PO TABS
80.0000 mg | ORAL_TABLET | Freq: Every day | ORAL | 3 refills | Status: DC
  Filled 2022-09-12: qty 30, 30d supply, fill #0

## 2022-09-12 MED ORDER — TICAGRELOR 90 MG PO TABS
90.0000 mg | ORAL_TABLET | Freq: Two times a day (BID) | ORAL | 3 refills | Status: DC
  Filled 2022-09-12: qty 60, 30d supply, fill #0

## 2022-09-12 MED ORDER — NITROGLYCERIN 0.4 MG SL SUBL
0.4000 mg | SUBLINGUAL_TABLET | SUBLINGUAL | 12 refills | Status: DC | PRN
  Filled 2022-09-12: qty 25, 7d supply, fill #0

## 2022-09-12 MED ORDER — POTASSIUM CHLORIDE CRYS ER 20 MEQ PO TBCR
40.0000 meq | EXTENDED_RELEASE_TABLET | Freq: Once | ORAL | Status: AC
  Administered 2022-09-12: 40 meq via ORAL
  Filled 2022-09-12: qty 2

## 2022-09-12 MED ORDER — CARVEDILOL 6.25 MG PO TABS
6.2500 mg | ORAL_TABLET | Freq: Two times a day (BID) | ORAL | 3 refills | Status: DC
  Filled 2022-09-12: qty 60, 30d supply, fill #0

## 2022-09-12 MED FILL — Nitroglycerin IV Soln 100 MCG/ML in D5W: INTRA_ARTERIAL | Qty: 10 | Status: AC

## 2022-09-12 NOTE — Telephone Encounter (Signed)
  Pt is scheduled for TOC with Vin Bhagat on 09/23/22 at 2:20 pm per Vin   Also, need to set up interpreter per [facility]

## 2022-09-12 NOTE — Discharge Summary (Addendum)
Discharge Summary    Patient ID: Clinton Anderson MRN: [id number]; DOB: [birth year]  Admit date: 09/10/2022 Discharge date: 09/12/2022  PCP:  Patient, No Pcp Per   [place] Prov[facility] Cardiologist:  Pixie Casino, MD   {    Discharge Diagnoses    Principal Problem:   NSTEMI (non-ST elevated myocardial infarction) ([place])   Elevated Blood pressure   HLD   CAD   Hypokalemia   Diagnostic Studies/Procedures     CORONARY STENT INTERVENTION  LEFT HEART CATH AND CORONARY ANGIOGRAPHY   Conclusion      2nd Mrg lesion is 99% stenosed.   3rd Mrg lesion is 60% stenosed.   LPDA lesion is 80% stenosed.   Prox LAD lesion is 99% stenosed.   Mid RCA lesion is 40% stenosed.   A drug-eluting stent was successfully placed using a SYNERGY XD 3.0X16.   Post intervention, there is a 0% residual stenosis.   Severe proximal LAD stenosis.  Successful PTCA/DES x 1 proximal LAD Large, dominant Circumflex artery. The main AV groove segment has no obstructive disease. There are several very small caliber obtuse marginal branches that have severe disease. These branches are too small for stenting.  The RCA is a small non-dominant vessel with mild mid stenosis.    Recommendations: Continue DAPT with ASA and Brilinta for one year. Continue high intensity statin and beta blocker.   Diagnostic Dominance: Left  Echo 09/11/22  1. Left ventricular ejection fraction, by estimation, is 60 to 65%. The  left ventricle has normal function. The left ventricle demonstrates  regional wall motion abnormalities (see scoring diagram/findings for  description). Left ventricular diastolic  parameters are consistent with Grade I diastolic dysfunction (impaired  relaxation).   2. Right ventricular systolic function is normal. The right ventricular  size is normal. Tricuspid regurgitation signal is inadequate for assessing  PA pressure.   3. The mitral valve is grossly normal. Trivial mitral  valve  regurgitation. No evidence of mitral stenosis.   4. The aortic valve is tricuspid. Aortic valve regurgitation is not  visualized. No aortic stenosis is present.   5. The inferior vena cava is normal in size with greater than 50%  respiratory variability, suggesting right atrial pressure of 3 mmHg.   FINDINGS   Left Ventricle: Left ventricular ejection fraction, by estimation, is 60  to 65%. The left ventricle has normal function. The left ventricle  demonstrates regional wall motion abnormalities. The left ventricular  internal cavity size was normal in size.  There is no left ventricular hypertrophy. Left ventricular diastolic  parameters are consistent with Grade I diastolic dysfunction (impaired  relaxation).      LV Wall Scoring:  The basal inferolateral segment is hypokinetic.    History of Present Illness     Clinton Anderson is a 51 y.o. male with history of alcohol abuse otherwise no known medical history presented for exertional chest pain evaluation and found to have elevated troponin.  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 He felt very breathless when this started, and breathing hard seemed to make the pain worse. He denies diaphoresis or nausea. He stopped working and took Tylenol which relieved the pain slightly, but came to the ED when it persisted. He states he does not have family history of heart disease and does not smoke. He is not prescribed any medications.    He was hypertensive  on arrival. ECG showed borderline criteria for inferior infarct, also noted on prior ECG, but no acute changes. Initial troponin was 513 that doubled on repeat. He underwent CTA to rule out dissection as well given radiation into the back, but this did not show any significant abnormalities, including no evidence of acute aortic syndrome or PE. He received 3 sublingual nitroglycerin, heparin,  aspirin. Hospital Course     Consultants: None  NSTEMI - Chest pain free after SL nitro x 3 & heparin. No dissection on CT of chest.  - Troponin peaked at 3176  - UDS clear - Echocardiogram with preserved LV function and basal inferior lateral hypokinesis - Cardiac catheterization showed severe proximal LAD stenosis s/p PTCA and DES placement. There are several very small caliber obtuse marginal branches that have severe disease. These branches are too small for stenting.  -Plan for dual antiplatelet therapy with aspirin and Brilinta for 12 months.  However, he does not have insurance.  Likely need to switch to Plavix after 1 month. Will work to apply for assistance.  -Continue statin and beta-blocker  2.  Elevated blood pressure without hypertension -Blood pressure normalized on beta-blocker  3.  Hyperlipidemia -09/11/2022: Cholesterol 249; HDL 45; LDL Cholesterol 156; Triglycerides 239; VLDL 48  -Continue statin -Lipid Panel and LFTs in 6 to 8 weeks   Did the patient have an acute coronary syndrome (MI, NSTEMI, STEMI, etc) this admission?:  Yes                               AHA/ACC Clinical Performance & Quality Measures: Aspirin prescribed? - Yes ADP Receptor Inhibitor (Plavix/Clopidogrel, Brilinta/Ticagrelor or Effient/Prasugrel) prescribed (includes medically managed patients)? - Yes Beta Blocker prescribed? - Yes High Intensity Statin (Lipitor 40-80mg  or Crestor 20-40mg ) prescribed? - Yes EF assessed during THIS hospitalization? - Yes For EF <40%, was ACEI/ARB prescribed? - Not Applicable (EF >/= [id number]) For EF <40%, Aldosterone Antagonist (Spironolactone or Eplerenone) prescribed? - Not Applicable (EF >/= [id number]) Cardiac Rehab Phase II ordered (including medically managed patients)? - Yes    The patient will be scheduled for a TOC follow up appointment in 10 days.  A message has been sent to the [facility] and Scheduling Pool at the office where the patient should be seen for follow  up.  _____________  Discharge Vitals Blood pressure 107/72, pulse 76, temperature 99.1 F (37.3 C), temperature source Oral, resp. rate 12, height 5\' 4"  (1.626 m), weight 70.1 kg, SpO2 99 %.  Filed Weights   09/10/22 2100 09/11/22 1241  Weight: 72.6 kg 70.1 kg    Labs & Radiologic Studies    CBC Recent Labs    09/11/22 0327 09/12/22 0346  WBC 5.9 7.4  HGB 14.0 13.1  HCT 42.2 39.2  MCV 79.5* 78.6*  PLT 190 [id number]   Basic Metabolic Panel Recent Labs    09/10/22 1558 09/12/22 0346  NA 138 135  K 3.9 3.4*  CL 105 103  CO2 23 25  GLUCOSE 95 104*  BUN 9 13  CREATININE 0.92 0.76  CALCIUM 9.1 9.0   Liver Function Tests Recent Labs    09/10/22 2130  AST 40  ALT 39  ALKPHOS 95  BILITOT 0.9  PROT 6.9  ALBUMIN 3.6   No results for input(s): "LIPASE", "AMYLASE" in the last 72 hours. High Sensitivity Troponin:   Recent Labs  Lab 09/10/22 1558 09/10/22 1832 09/10/22 2300 09/11/22 0327  TROPONINIHS 513*  1,278* 3,176* 2,533*     Hemoglobin A1C Recent Labs    09/11/22 0327  HGBA1C 5.6   Fasting Lipid Panel Recent Labs    09/11/22 0327  CHOL 249*  HDL 45  LDLCALC 156*  TRIG 239*  CHOLHDL 5.5   Thyroid Function Tests Recent Labs    09/11/22 0327  TSH 2.343   _____________  CARDIAC CATHETERIZATION  Result Date: 09/11/2022   2nd Mrg lesion is 99% stenosed.   3rd Mrg lesion is 60% stenosed.   LPDA lesion is 80% stenosed.   Prox LAD lesion is 99% stenosed.   Mid RCA lesion is 40% stenosed.   A drug-eluting stent was successfully placed using a SYNERGY XD 3.0X16.   Post intervention, there is a 0% residual stenosis. Severe proximal LAD stenosis. Successful PTCA/DES x 1 proximal LAD Large, dominant Circumflex artery. The main AV groove segment has no obstructive disease. There are several very small caliber obtuse marginal branches that have severe disease. These branches are too small for stenting. The RCA is a small non-dominant vessel with mild mid stenosis.  Recommendations: Continue DAPT with ASA and Brilinta for one year. Continue high intensity statin and beta blocker.   ECHOCARDIOGRAM COMPLETE  Result Date: 09/11/2022    ECHOCARDIOGRAM REPORT   Patient Name:   BART BARMES [facility] Date of Exam: 09/11/2022 Medical Rec #:  [id number]           Height:       64.0 in Accession #:    [id number]          Weight:       160.0 lb Date of Birth:  20-Feb-1971           BSA:          1.779 m Patient Age:    58 years            BP:           115/68 mmHg Patient Gender: M                   HR:           78 bpm. Exam Location:  Inpatient Procedure: 2D Echo, Cardiac Doppler and Color Doppler Indications:     NSTEMI  History:         Patient has no prior history of Echocardiogram examinations.  Sonographer:     Memory Argue Referring Phys:  [id number] W ROSE Diagnosing Phys: Eleonore Chiquito MD IMPRESSIONS  1. Left ventricular ejection fraction, by estimation, is 60 to 65%. The left ventricle has normal function. The left ventricle demonstrates regional wall motion abnormalities (see scoring diagram/findings for description). Left ventricular diastolic parameters are consistent with Grade I diastolic dysfunction (impaired relaxation).  2. Right ventricular systolic function is normal. The right ventricular size is normal. Tricuspid regurgitation signal is inadequate for assessing PA pressure.  3. The mitral valve is grossly normal. Trivial mitral valve regurgitation. No evidence of mitral stenosis.  4. The aortic valve is tricuspid. Aortic valve regurgitation is not visualized. No aortic stenosis is present.  5. The inferior vena cava is normal in size with greater than 50% respiratory variability, suggesting right atrial pressure of 3 mmHg. FINDINGS  Left Ventricle: Left ventricular ejection fraction, by estimation, is 60 to 65%. The left ventricle has normal function. The left ventricle demonstrates regional wall motion abnormalities. The left ventricular internal cavity size was  normal in size. There is no left ventricular hypertrophy. Left ventricular  diastolic parameters are consistent with Grade I diastolic dysfunction (impaired relaxation).  LV Wall Scoring: The basal inferolateral segment is hypokinetic. Right Ventricle: The right ventricular size is normal. No increase in right ventricular wall thickness. Right ventricular systolic function is normal. Tricuspid regurgitation signal is inadequate for assessing PA pressure. Left Atrium: Left atrial size was normal in size. Right Atrium: Right atrial size was normal in size. Pericardium: There is no evidence of pericardial effusion. Mitral Valve: The mitral valve is grossly normal. Trivial mitral valve regurgitation. No evidence of mitral valve stenosis. Tricuspid Valve: The tricuspid valve is grossly normal. Tricuspid valve regurgitation is trivial. No evidence of tricuspid stenosis. Aortic Valve: The aortic valve is tricuspid. Aortic valve regurgitation is not visualized. No aortic stenosis is present. Aortic valve mean gradient measures 2.0 mmHg. Aortic valve peak gradient measures 4.5 mmHg. Aortic valve area, by VTI measures 2.96 cm. Pulmonic Valve: The pulmonic valve was not well visualized. Pulmonic valve regurgitation is not visualized. Aorta: The aortic root and ascending aorta are structurally normal, with no evidence of dilitation. Venous: The inferior vena cava is normal in size with greater than 50% respiratory variability, suggesting right atrial pressure of 3 mmHg. IAS/Shunts: The atrial septum is grossly normal.  LEFT VENTRICLE PLAX 2D LVIDd:         4.40 cm   Diastology LVIDs:         2.90 cm   LV e' medial:    6.31 cm/s LV PW:         1.10 cm   LV E/e' medial:  11.2 LV IVS:        1.00 cm   LV e' lateral:   9.03 cm/s LVOT diam:     2.00 cm   LV E/e' lateral: 7.8 LV SV:         56 LV SV Index:   32 LVOT Area:     3.14 cm  RIGHT VENTRICLE TAPSE (M-mode): 1.8 cm LEFT ATRIUM             Index        RIGHT ATRIUM            Index LA diam:        2.90 cm 1.63 cm/m   RA Area:     10.50 cm LA Vol (A2C):   36.1 ml 20.29 ml/m  RA Volume:   19.80 ml  11.13 ml/m LA Vol (A4C):   40.1 ml 22.54 ml/m LA Biplane Vol: 38.1 ml 21.41 ml/m  AORTIC VALVE AV Area (Vmax):    2.77 cm AV Area (Vmean):   2.74 cm AV Area (VTI):     2.96 cm AV Vmax:           106.00 cm/s AV Vmean:          70.600 cm/s AV VTI:            0.190 m AV Peak Grad:      4.5 mmHg AV Mean Grad:      2.0 mmHg LVOT Vmax:         93.40 cm/s LVOT Vmean:        61.600 cm/s LVOT VTI:          0.179 m LVOT/AV VTI ratio: 0.94  AORTA Ao Root diam: 3.20 cm Ao Asc diam:  2.90 cm MITRAL VALVE MV Area (PHT): 4.31 cm    SHUNTS MV Decel Time: 176 msec    Systemic VTI:  0.18 m MV E velocity:  70.40 cm/s  Systemic Diam: 2.00 cm MV A velocity: 83.20 cm/s MV E/A ratio:  0.85 Eleonore Chiquito MD Electronically signed by Eleonore Chiquito MD Signature Date/Time: 09/11/2022/2:17:33 PM    Final (Updated)    CT Angio Chest/Abd/Pel for Dissection W and/or Wo Contrast  Result Date: 09/10/2022 CLINICAL DATA:  Chest pain, back pain, shortness of breath EXAM: CT ANGIOGRAPHY CHEST, ABDOMEN AND PELVIS TECHNIQUE: Non-contrast CT of the chest was initially obtained. Multidetector CT imaging through the chest, abdomen and pelvis was performed using the standard protocol during bolus administration of intravenous contrast. Multiplanar reconstructed images and MIPs were obtained and reviewed to evaluate the vascular anatomy. RADIATION DOSE REDUCTION: This exam was performed according to the departmental dose-optimization program which includes automated exposure control, adjustment of the mA and/or kV according to patient size and/or use of iterative reconstruction technique. CONTRAST:  178mL OMNIPAQUE IOHEXOL 350 MG/ML SOLN COMPARISON:  09/13/2017 FINDINGS: CTA CHEST FINDINGS Cardiovascular: In the noncontrast images, there is no demonstrable mural hematoma in thoracic aorta. Scattered tiny coronary artery  calcifications are seen. There is homogeneous enhancement in thoracic aorta. There is no demonstrable intimal flap. There are no intraluminal filling defects in pulmonary artery branches. Mediastinum/Nodes: No significant lymphadenopathy is seen. Lungs/Pleura: There is no focal pulmonary consolidation. There is no pleural effusion or pneumothorax. Musculoskeletal: Bony structures in the thorax are unremarkable. Review of the MIP images confirms the above findings. CTA ABDOMEN AND PELVIS FINDINGS VASCULAR Aorta: Appears normal Celiac: Appears normal SMA: Appears normal Renals: Appear normal IMA: Patent Inflow: Appears normal Veins: Unremarkable. Review of the MIP images confirms the above findings. NON-VASCULAR Hepatobiliary: There is fatty infiltration. There is no dilation of bile ducts. There is questionable minimal nodularity in the liver surface. Gallbladder is not distended. Pancreas: No focal abnormalities are seen. Spleen: Unremarkable. Adrenals/Urinary Tract: Adrenals are unremarkable. There is no hydronephrosis. There are no renal or ureteral stones. Urinary bladder is unremarkable. Stomach/Bowel: Stomach is unremarkable. Small bowel loops are not dilated. Appendix is not dilated. Scattered diverticula are seen in colon. There is no focal pericolic inflammation. There is incomplete distention of sigmoid colon. Lymphatic: Unremarkable. Reproductive: Unremarkable. Other: There is no ascites or pneumoperitoneum. Small umbilical hernia containing fat is seen. Left inguinal hernia containing fat is seen. Musculoskeletal: No acute findings are seen. Review of the MIP images confirms the above findings. IMPRESSION: There is no evidence of dissection in thoracic and abdominal aorta. Major branches of thoracic and abdominal aorta appear patent. There is no evidence of pulmonary artery embolism. There is no focal pulmonary consolidation. There is no evidence of intestinal obstruction or pneumoperitoneum. There is no  hydronephrosis. Appendix is not dilated. Diverticulosis of colon without signs of focal acute diverticulitis. Fatty liver. Other findings as described in the body of the report. Electronically Signed   By: Elmer Picker M.D.   On: 09/10/2022 21:07   DG Chest 2 View  Result Date: 09/10/2022 CLINICAL DATA:  Chest pain, shortness of breath EXAM: CHEST - 2 VIEW COMPARISON:  11/18/2020 FINDINGS: Transverse diameter of heart is slightly increased. There is poor inspiration. There are no signs of pulmonary edema or focal pulmonary consolidation. There is slight increase in markings in both lower lung fields. There is no pleural effusion or pneumothorax. IMPRESSION: There are no signs of pulmonary edema or focal pulmonary consolidation. Increased markings in both lower lung fields may suggest crowding of bronchovascular structures due to poor inspiration or subsegmental atelectasis. Electronically Signed   By: Prudy Feeler.D.  On: 09/10/2022 16:38    Disposition   Pt is being discharged home today in good condition.  Follow-up Plans & Appointments     Follow-up Information     [facility], Crista Luria, [place] Follow up on 09/23/2022.   Specialty: Cardiology Why: @2 :20pm for hospital follow up Contact information: [address] [place] [address] [phone]                Discharge Instructions     Diet - low sodium heart healthy   Complete by: As directed    Discharge instructions   Complete by: As directed    NO HEAVY LIFTING (>10lbs) X 2 WEEKS. NO SEXUAL ACTIVITY X 2 WEEKS. NO DRIVING X 1 WEEK. NO SOAKING BATHS, HOT TUBS, POOLS, ETC., X 7 DAYS.   Increase activity slowly   Complete by: As directed        Discharge Medications   Allergies as of 09/12/2022   No Known Allergies      Medication List     TAKE these medications    acetaminophen 500 MG tablet Commonly known as: TYLENOL Take 500-1,000 mg by mouth every 6 (six) hours as needed for  mild pain.   aspirin EC 81 MG tablet Take 1 tablet (81 mg total) by mouth daily. Swallow whole. What changed:  medication strength how much to take when to take this reasons to take this additional instructions   atorvastatin 80 MG tablet Commonly known as: LIPITOR Tome 1 tableta (80 mg en total) por va oral diariamente. (Take 1 tablet (80 mg total) by mouth daily.)   carvedilol 6.25 MG tablet Commonly known as: COREG Take 1 tablet (6.25 mg total) by mouth 2 (two) times daily with a meal.   nitroGLYCERIN 0.4 MG SL tablet Commonly known as: NITROSTAT Place 1 tablet (0.4 mg total) under the tongue every 5 (five) minutes as needed for chest pain.   ticagrelor 90 MG Tabs tablet Commonly known as: BRILINTA Take 1 tablet (90 mg total) by mouth 2 (two) times daily.           Outstanding Labs/Studies   Consider OP f/u labs 6-8 weeks given statin initiation this admission.   Duration of Discharge Encounter   Greater than 30 minutes including physician time.  Jarrett Soho, PA 09/12/2022, 9:15 AM

## 2022-09-12 NOTE — Progress Notes (Addendum)
CARDIAC REHAB PHASE I   PRE:  Rate/Rhythm: 64/NSR    BP: lying 131/95    SaO2: 99% RA  MODE:  Ambulation: 470 ft   POST:  Rate/Rhythm: 91/NSR    BP: sitting 125/102,                                            140/90     SaO2: 100%RA  Patient ambulated 423ft with supervision, tolerated well, denies SOB, dizziness, CP. Patient educated with use of spanish interpreter [phone], Regino Schultze, educated on risk factors, exercise guidelines, angina, ntg, ptca, MI, restrictions, nutrition, Brilinta, education material given in [place], CR not appropriate for patient d/t no insurance.  [place] RN 09/12/2022 9:15 AM

## 2022-09-12 NOTE — Care Management (Signed)
  Transition of Care [facility]) Screening Note   Patient Details  Name: Clinton Anderson Date of Birth: Aug 17, 1971   Transition of Care [facility]) CM/SW Contact:    Bethena Roys, RN Phone Number: 09/12/2022, 10:08 AM    Transition of Care Department [facility]) has reviewed the patient. Patient is without insurance and PCP at this time. Case Manager spoke with patient with Interpreter present in the room. Case Manager verified with the patient that the writer can schedule an appointment at the Internal Medicine Clinic. Appointment has been placed on the AVS. Medications will be delivered via the [place]. No further needs identified at this time.

## 2022-09-15 NOTE — Telephone Encounter (Signed)
**Note De-Identified [place] Obfuscation** [place] ID: [id number] with [employer] assisted me with this call.  Patient contacted regarding discharge from [facility] on 09/12/2022.  Patient understands to follow up with provider Robbie Lis, PA-c on 09/23/2022 at 2:20 at [address], [place] in [place], [place] [address].  Patient understands discharge instructions? Yes Patient understands medications and regiment? Yes Patient understands to bring all medications to this visit? Yes  Ask patient:  Are you enrolled in My Chart: Yes  The pt states that he is doing well and denies having any CP/discomfort, SOB, nausea, vomiting, diaphoresis, dizziness, or lightheadedness since returning home from the hospital.  Per his request I did send him a [facility] message with our office phone number, his hosp f/u appointment time and date, and our office address.

## 2022-09-16 ENCOUNTER — Other Ambulatory Visit (HOSPITAL_COMMUNITY): Payer: Self-pay

## 2022-09-18 ENCOUNTER — Encounter: Payer: Self-pay | Admitting: Internal Medicine

## 2022-09-18 DIAGNOSIS — E785 Hyperlipidemia, unspecified: Secondary | ICD-10-CM | POA: Insufficient documentation

## 2022-09-18 DIAGNOSIS — I10 Essential (primary) hypertension: Secondary | ICD-10-CM | POA: Insufficient documentation
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
## 2022-09-23 ENCOUNTER — Encounter: Payer: Self-pay | Admitting: Physician Assistant

## 2022-09-23 ENCOUNTER — Ambulatory Visit: Payer: MEDICAID | Attending: Physician Assistant | Admitting: Physician Assistant

## 2022-09-23 VITALS — BP 124/86 | HR 83 | Ht 59.0 in | Wt 159.0 lb

## 2022-09-23 DIAGNOSIS — E782 Mixed hyperlipidemia: Secondary | ICD-10-CM

## 2022-09-23 DIAGNOSIS — I214 Non-ST elevation (NSTEMI) myocardial infarction: Secondary | ICD-10-CM

## 2022-09-23 DIAGNOSIS — I2510 Atherosclerotic heart disease of native coronary artery without angina pectoris: Secondary | ICD-10-CM
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
## 2022-09-23 NOTE — Patient Instructions (Signed)
Medication Instructions:  Your physician recommends that you continue on your current medications as directed. Please refer to the Current Medication list given to you today. *If you need a refill on your cardiac medications before your next appointment, please call your pharmacy*   Lab Work: 2 months- [place] LFT If you have labs (blood work) drawn today and your tests are completely normal, you will receive your results only by: [place] (if you have MyChart) OR A paper copy in the mail If you have any lab test that is abnormal or we need to change your treatment, we will call you to review the results.   Testing/Procedures: NONE ORDERED   Follow-Up: At [facility], you and your health needs are our priority.  As part of our continuing mission to provide you with exceptional heart care, we have created designated Provider Care Teams.  These Care Teams include your primary Cardiologist (physician) and Advanced Practice Providers (APPs -  Physician Assistants and Nurse Practitioners) who all work together to provide you with the care you need, when you need it.  We recommend signing up for the patient portal called "MyChart".  Sign up information is provided on this After Visit Summary.  MyChart is used to connect with patients for Virtual Visits (Telemedicine).  Patients are able to view lab/test results, encounter notes, upcoming appointments, etc.  Non-urgent messages can be sent to your provider as well.   To learn more about what you can do with MyChart, go to [email or web address].    Your next appointment:   4 month(s)  The format for your next appointment:   In Person  Provider:   Pixie Casino, MD     Other Instructions   Important Information About Sugar

## 2022-09-24 ENCOUNTER — Telehealth: Payer: Self-pay

## 2022-09-24 NOTE — Telephone Encounter (Addendum)
**Note De-Identified Crispin Vogel Obfuscation** The pts completed [place] [employer] [place] pt asst application for Brilinta was e-mailed to me signed and dated by Robbie Lis, PA-c. I have faxed the application to [employer].  Fax: Tx 'ok' Report CONE_EMAIL-to-Fax Karmina Zufall, Mardene Celeste   This message was sent Jani Ploeger [facility], a product from [employer] http://www.biscom.com/  [employer] pioneered [employer] and continues to innovate the most advanced and intelligent fax and secure messaging solutions for enterprises. www.biscom.com                     -------Fax Transmission Report-------  To:               Recipient at [phone] Subject:          Fw: Brilinta Assistance Result:           The transmission was successful. Explanation:      All Pages Ok Pages Sent:       4 Connect Time:     2 minutes, 47 seconds Transmit Time:    09/24/2022 12:11 Transfer Rate:    14400 Status Code:      0000 Retry Count:      0 Job Id:           [id number] Unique Id:        [id number] Fax Line:         23 Fax Server:       [id number]

## 2022-09-26 NOTE — Telephone Encounter (Addendum)
**Note De-Identified Mung Rinker Obfuscation** Per letter received Celestia Duva fax from [place] [employer], they have denied the pt assistance for Bilinta. Reason: Ins. Eligibility Criteria not met-The pts income reflects they maybe eligible for an alternate funding source that covers Brilinta. Alternate Funding Source: [place] Medicaid.  The letter states that they have also advised the pt of this denial as well.  Pt ID: [id number]

## 2022-10-06 ENCOUNTER — Telehealth: Payer: Self-pay | Admitting: Internal Medicine

## 2022-10-06 MED ORDER — TICAGRELOR 90 MG PO TABS: 90.0000 mg | ORAL_TABLET | Freq: Two times a day (BID) | ORAL | 3 refills | Status: DC

## 2022-10-06 MED ORDER — CARVEDILOL 6.25 MG PO TABS: 6.2500 mg | ORAL_TABLET | Freq: Two times a day (BID) | ORAL | 3 refills | Status: DC

## 2022-10-06 MED ORDER — ATORVASTATIN CALCIUM 80 MG PO TABS: 80.0000 mg | ORAL_TABLET | Freq: Every day | ORAL | 3 refills | Status: DC

## 2022-10-06 NOTE — Telephone Encounter (Signed)
*  STAT* If patient is at the pharmacy, call can be transferred to refill team.   1. Which medications need to be refilled? (please list name of each medication and dose if known) atorvastatin (LIPITOR) 80 MG tablet carvedilol (COREG) 6.25 MG tablet ticagrelor (BRILINTA) 90 MG TABS tablet atorvastatin (LIPITOR) 80 MG tablet  2. Which pharmacy/location (including street and city if local pharmacy) is medication to be sent to? [id number] - [place], [place] - [address] AT [place]  3. Do they need a 30 day or 90 day supply? 30  Pt states that they only have 2 days left on some of these

## 2022-11-06 ENCOUNTER — Telehealth: Payer: Self-pay | Admitting: Internal Medicine

## 2022-11-06 MED ORDER — ASPIRIN 81 MG PO TBEC: 81.0000 mg | DELAYED_RELEASE_TABLET | Freq: Every day | ORAL | 3 refills | Status: DC

## 2022-11-06 MED ORDER — ATORVASTATIN CALCIUM 80 MG PO TABS: 80.0000 mg | ORAL_TABLET | Freq: Every day | ORAL | 3 refills | Status: DC

## 2022-11-06 MED ORDER — TICAGRELOR 90 MG PO TABS: 90.0000 mg | ORAL_TABLET | Freq: Two times a day (BID) | ORAL | 3 refills | Status: DC

## 2022-11-06 MED ORDER — CARVEDILOL 6.25 MG PO TABS: 6.2500 mg | ORAL_TABLET | Freq: Two times a day (BID) | ORAL | 3 refills | Status: DC

## 2022-11-06 NOTE — Telephone Encounter (Signed)
*  STAT* If patient is at the pharmacy, call can be transferred to refill team.   1. Which medications need to be refilled? (please list name of each medication and dose if known)   aspirin EC 81 MG tablet    atorvastatin (LIPITOR) 80 MG tablet  carvedilol (COREG) 6.25 MG tablet   ticagrelor (BRILINTA) 90 MG TABS tablet   2. Which pharmacy/location (including street and city if local pharmacy) is medication to be sent to?   [id number] - [place], [place] - [address]   3. Do they need a 30 day or 90 day supply? 90

## 2023-02-12 ENCOUNTER — Encounter: Payer: Self-pay | Admitting: Internal Medicine

## 2023-02-12 ENCOUNTER — Ambulatory Visit: Payer: MEDICAID | Attending: Internal Medicine | Admitting: Internal Medicine

## 2023-02-12 VITALS — BP 132/80 | HR 68 | Ht 59.0 in | Wt 159.0 lb

## 2023-02-12 DIAGNOSIS — I214 Non-ST elevation (NSTEMI) myocardial infarction: Secondary | ICD-10-CM | POA: Diagnosis not present

## 2023-02-12 DIAGNOSIS — I2510 Atherosclerotic heart disease of native coronary artery without angina pectoris: Secondary | ICD-10-CM | POA: Diagnosis not present

## 2023-02-12 DIAGNOSIS — E782 Mixed hyperlipidemia: Secondary | ICD-10-CM | POA: Diagnosis not present

## 2023-02-12 MED ORDER — CLOPIDOGREL BISULFATE 75 MG PO TABS: 75.0000 mg | ORAL_TABLET | Freq: Every day | ORAL | 3 refills | Status: DC
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
## 2023-02-12 NOTE — Patient Instructions (Addendum)
Medication Instructions:  STOP Brilinta  START Clopidogrel (Plavix) once daily  *If you need a refill on your cardiac medications before your next appointment, please call your pharmacy*   Lab Work: FASTING lab work to check cholesterol   If you have labs (blood work) drawn today and your tests are completely normal, you will receive your results only by: [place] (if you have MyChart) OR A paper copy in the mail If you have any lab test that is abnormal or we need to change your treatment, we will call you to review the results.   Follow-Up: At [facility], you and your health needs are our priority.  As part of our continuing mission to provide you with exceptional heart care, we have created designated Provider Care Teams.  These Care Teams include your primary Cardiologist (physician) and Advanced Practice Providers (APPs -  Physician Assistants and Nurse Practitioners) who all work together to provide you with the care you need, when you need it.  We recommend signing up for the patient portal called "MyChart".  Sign up information is provided on this After Visit Summary.  MyChart is used to connect with patients for Virtual Visits (Telemedicine).  Patients are able to view lab/test results, encounter notes, upcoming appointments, etc.  Non-urgent messages can be sent to your provider as well.   To learn more about what you can do with MyChart, go to [email or web address].    Your next appointment:    September 2024 with Dr. Debara Pickett with Dr. Debara Pickett

## 2023-02-13 ENCOUNTER — Other Ambulatory Visit: Payer: Self-pay

## 2023-02-13 DIAGNOSIS — E782 Mixed hyperlipidemia: Secondary | ICD-10-CM

## 2023-02-13 DIAGNOSIS — I2510 Atherosclerotic heart disease of native coronary artery without angina pectoris: Secondary | ICD-10-CM

## 2023-02-13 DIAGNOSIS — I214 Non-ST elevation (NSTEMI) myocardial infarction: Secondary | ICD-10-CM

## 2023-02-13 LAB — LIPID PANEL
Chol/HDL Ratio: 3.5 ratio (ref 0.0–5.0)
Cholesterol, Total: 138 mg/dL (ref 100–199)
HDL: 39 mg/dL — ABNORMAL LOW (ref >39)
LDL Chol Calc (NIH): 77 mg/dL (ref 0–99)
Triglycerides: 120 mg/dL (ref 0–149)
VLDL Cholesterol Cal: 22 mg/dL (ref 5–40)

## 2023-02-17 ENCOUNTER — Encounter: Payer: Self-pay | Admitting: Internal Medicine

## 2023-08-28 ENCOUNTER — Encounter: Payer: Self-pay | Admitting: Internal Medicine

## 2023-08-28 ENCOUNTER — Ambulatory Visit: Payer: 59 | Attending: Internal Medicine | Admitting: Internal Medicine

## 2023-08-28 VITALS — BP 138/86 | HR 76 | Ht <= 58 in | Wt 164.0 lb

## 2023-08-28 DIAGNOSIS — E782 Mixed hyperlipidemia: Secondary | ICD-10-CM

## 2023-08-28 DIAGNOSIS — I2510 Atherosclerotic heart disease of native coronary artery without angina pectoris: Secondary | ICD-10-CM

## 2023-08-28 DIAGNOSIS — I214 Non-ST elevation (NSTEMI) myocardial infarction: Secondary | ICD-10-CM

## 2023-08-28 DIAGNOSIS — I10 Essential (primary) hypertension: Secondary | ICD-10-CM | POA: Diagnosis not present

## 2023-08-28 MED ORDER — NITROGLYCERIN 0.4 MG SL SUBL: 0.4000 mg | SUBLINGUAL_TABLET | SUBLINGUAL | 3 refills | Status: AC | PRN

## 2023-08-28 NOTE — Patient Instructions (Signed)
Medication Instructions:  OK to STOP clopidogrel (plavix) when you finish out this prescription   *If you need a refill on your cardiac medications before your next appointment, please call your pharmacy*   Follow-Up: At [facility], you and your health needs are our priority.  As part of our continuing mission to provide you with exceptional heart care, we have created designated Provider Care Teams.  These Care Teams include your primary Cardiologist (physician) and Advanced Practice Providers (APPs -  Physician Assistants and Nurse Practitioners) who all work together to provide you with the care you need, when you need it.  We recommend signing up for the patient portal called "MyChart".  Sign up information is provided on this After Visit Summary.  MyChart is used to connect with patients for Virtual Visits (Telemedicine).  Patients are able to view lab/test results, encounter notes, upcoming appointments, etc.  Non-urgent messages can be sent to your provider as well.   To learn more about what you can do with MyChart, go to [email or web address].    Your next appointment:   12 months with Dr. Rennis Golden

## 2023-08-28 NOTE — Addendum Note (Signed)
Addended by: Lindell Spar on: 08/28/2023 08:37 AM   Modules accepted: Orders
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
## 2023-11-13 ENCOUNTER — Other Ambulatory Visit: Payer: Self-pay | Admitting: Internal Medicine

## 2024-04-24 ENCOUNTER — Emergency Department (HOSPITAL_COMMUNITY)
Admission: EM | Admit: 2024-04-24 | Discharge: 2024-04-24 | Disposition: A | Discharge status: Home / Self Care | Payer: MEDICAID | Attending: Emergency Medicine | Admitting: Emergency Medicine

## 2024-04-24 ENCOUNTER — Other Ambulatory Visit: Payer: Self-pay

## 2024-04-24 ENCOUNTER — Encounter (HOSPITAL_COMMUNITY): Payer: Self-pay | Admitting: Emergency Medicine

## 2024-04-24 DIAGNOSIS — Z955 Presence of coronary angioplasty implant and graft: Secondary | ICD-10-CM | POA: Insufficient documentation

## 2024-04-24 DIAGNOSIS — L989 Disorder of the skin and subcutaneous tissue, unspecified: Secondary | ICD-10-CM | POA: Insufficient documentation

## 2024-04-24 DIAGNOSIS — Z7982 Long term (current) use of aspirin: Secondary | ICD-10-CM | POA: Insufficient documentation

## 2024-04-24 DIAGNOSIS — I2510 Atherosclerotic heart disease of native coronary artery without angina pectoris: Secondary | ICD-10-CM | POA: Insufficient documentation

## 2024-04-24 MED ORDER — CEPHALEXIN 500 MG PO CAPS: 500.0000 mg | ORAL_CAPSULE | Freq: Four times a day (QID) | ORAL | 0 refills | Status: DC

## 2024-04-24 NOTE — ED Provider Notes (Signed)
  EMERGENCY DEPARTMENT AT [facility] Provider Note   CSN: [id number] Arrival date & time: 04/24/24  4098     History  Chief Complaint  Patient presents with   Abscess    Clinton Anderson is a 53 y.o. male.  HPI Spanish interpreter utilized via video 53 year old male known history of coronary artery disease status post stent presents today complaining of area in the mid back that he reports has been there for 4 months.  He has noticed some thick whitish discharge from.  Hip is causing him some discomfort.  He is able to push discharge out of it.  It is not red, streaking, fluctuant, there have been no fevers.  He has not sought previous evaluation of this.     Home Medications Prior to Admission medications   Medication Sig Start Date End Date Taking? Authorizing Provider  cephALEXin  (KEFLEX ) 500 MG capsule Take 1 capsule (500 mg total) by mouth 4 (four) times daily. 04/24/24  Yes Auston Blush, MD  acetaminophen  (TYLENOL ) 500 MG tablet Take 500-1,000 mg by mouth every 6 (six) hours as needed for mild pain.    [provider]  aspirin  EC ([facility] ASPIRIN  LOW DOSE) 81 MG tablet TAKE 1 TABLET (81 MG TOTAL) BY MOUTH DAILY. SWALLOW WHOLE. 11/13/23   Hazle Lites, MD  atorvastatin  (LIPITOR) 80 MG tablet TAKE 1 TABLET BY MOUTH EVERY DAY 11/13/23   Hilty, Aviva Lemmings, MD  carvedilol  (COREG ) 6.25 MG tablet TAKE 1 TABLET BY MOUTH 2 TIMES DAILY WITH A MEAL. 11/13/23   Hilty, Aviva Lemmings, MD  nitroGLYCERIN  (NITROSTAT ) 0.4 MG SL tablet Place 1 tablet (0.4 mg total) under the tongue every 5 (five) minutes as needed for chest pain. 08/28/23   Hilty, Aviva Lemmings, MD      Allergies    Patient has no known allergies.    Review of Systems   Review of Systems  Physical Exam Updated Vital Signs BP (!) 165/95 (BP Location: Right Arm)   Pulse 65   Temp 98.2 F (36.8 C)   Resp 18   Ht 1.448 m (4\' 9" )   Wt 74 kg   SpO2 100%   BMI 35.30 kg/m  Physical Exam Vitals and  nursing note reviewed.  Constitutional:      Appearance: He is well-developed.  HENT:     Head: Normocephalic and atraumatic.     Right Ear: External ear normal.     Left Ear: External ear normal.     Nose: Nose normal.  Neck:     Trachea: No tracheal deviation.  Pulmonary:     Effort: Pulmonary effort is normal.  Musculoskeletal:        General: Normal range of motion.  Skin:    General: Skin is warm and dry.     Comments: 1 x 1 cm slightly raised area in the mid back that looks like it has previously drained.  It is same color as surrounding skin.  There is no warmth or fluctuance or drainage noted at this time.  There is no redness.  Neurological:     Mental Status: He is alert and oriented to person, place, and time.  Psychiatric:        Behavior: Behavior normal.     ED Results / Procedures / Treatments   Labs (all labs ordered are listed, but only abnormal results are displayed) Labs Reviewed - No data to display  EKG None  Radiology No results found.  Procedures  Procedures    Medications Ordered in ED Medications - No data to display  ED Course/ Medical Decision Making/ A&P                                 Medical Decision Making Risk Prescription drug management.   53 year old male with a lesion on his back.  Lesion could represent sebaceous cyst, small localized abscess, epidermoid cyst, or other dermatologic lesion including malignancy. Currently does not appear to be any need for any intervention.  It does not appear to be infected, have anything to drain in it.  However discussion with the patient we will start Keflex  and see if the symptoms are resolving. I discussed need for follow-up, specifically with dermatology.  He will reach out to try to obtain primary care so they can help guide treatment. Return precautions and need for follow-up discussed in depth       Final Clinical Impression(s) / ED Diagnoses Final diagnoses:  Skin lesion     Rx / DC Orders ED Discharge Orders          Ordered    cephALEXin  (KEFLEX ) 500 MG capsule  4 times daily        04/24/24 0731              Auston Blush, MD 04/24/24 [phone]

## 2024-04-24 NOTE — Discharge Instructions (Signed)
 Please make appointment to follow-up with primary care physician or dermatologist to assure that this lesion is benign. Return if you are having any worsening symptoms especially spreading redness, warmth, fever, or other worsening signs

## 2024-04-24 NOTE — ED Triage Notes (Signed)
 Pt to ED from home c/o abscess to mid back for months that he states is getting bigger.  States able to squeeze out some white drainage.

## 2024-07-14 ENCOUNTER — Emergency Department (HOSPITAL_COMMUNITY)
Admission: EM | Admit: 2024-07-14 | Discharge: 2024-07-14 | Disposition: A | Discharge status: Home / Self Care | Payer: Self-pay | Attending: Emergency Medicine | Admitting: Emergency Medicine

## 2024-07-14 ENCOUNTER — Other Ambulatory Visit: Payer: Self-pay

## 2024-07-14 DIAGNOSIS — S30861A Insect bite (nonvenomous) of abdominal wall, initial encounter: Secondary | ICD-10-CM | POA: Insufficient documentation

## 2024-07-14 DIAGNOSIS — Z7982 Long term (current) use of aspirin: Secondary | ICD-10-CM | POA: Insufficient documentation

## 2024-07-14 DIAGNOSIS — W57XXXA Bitten or stung by nonvenomous insect and other nonvenomous arthropods, initial encounter: Secondary | ICD-10-CM | POA: Insufficient documentation

## 2024-07-14 MED ORDER — CEPHALEXIN 250 MG PO CAPS
500.0000 mg | ORAL_CAPSULE | Freq: Once | ORAL | Status: AC
  Administered 2024-07-14: 500 mg via ORAL
  Filled 2024-07-14: qty 2

## 2024-07-14 MED ORDER — IBUPROFEN 400 MG PO TABS
600.0000 mg | ORAL_TABLET | Freq: Once | ORAL | Status: AC
  Administered 2024-07-14: 600 mg via ORAL
  Filled 2024-07-14: qty 1

## 2024-07-14 MED ORDER — CEPHALEXIN 500 MG PO CAPS: 500.0000 mg | ORAL_CAPSULE | Freq: Four times a day (QID) | ORAL | 0 refills | Status: AC

## 2024-07-14 NOTE — ED Triage Notes (Signed)
 Pt. Stated, I have 4 bug bites to left side of abdomen

## 2024-07-14 NOTE — ED Provider Notes (Addendum)
 [place] EMERGENCY DEPARTMENT AT [facility] Provider Note   CSN: [id number] Arrival date & time: 07/14/24  9345     Patient presents with: Insect Bite  HPI Clinton Anderson is a 53 y.o. male with history of CAD, NSTEMI, hypertension hyperlipidemia presenting for insect bites.  States he has 4 bug bites on the left flank and left lower abdomen.  He states he noticed them on Monday.  States that redness and swelling are worsening.  Denies fever and chills.  Unsure what may have bit him.   HPI     Prior to Admission medications   Medication Sig Start Date End Date Taking? Authorizing Provider  cephALEXin  (KEFLEX ) 500 MG capsule Take 1 capsule (500 mg total) by mouth 4 (four) times daily. 07/14/24  Yes Lang Norleen POUR, PA-C  acetaminophen  (TYLENOL ) 500 MG tablet Take 500-1,000 mg by mouth every 6 (six) hours as needed for mild pain.    [provider]  aspirin  EC ([facility] ASPIRIN  LOW DOSE) 81 MG tablet TAKE 1 TABLET (81 MG TOTAL) BY MOUTH DAILY. SWALLOW WHOLE. 11/13/23   Mona Vinie BROCKS, MD  atorvastatin  (LIPITOR) 80 MG tablet TAKE 1 TABLET BY MOUTH EVERY DAY 11/13/23   Hilty, Vinie BROCKS, MD  carvedilol  (COREG ) 6.25 MG tablet TAKE 1 TABLET BY MOUTH 2 TIMES DAILY WITH A MEAL. 11/13/23   Hilty, Vinie BROCKS, MD  nitroGLYCERIN  (NITROSTAT ) 0.4 MG SL tablet Place 1 tablet (0.4 mg total) under the tongue every 5 (five) minutes as needed for chest pain. 08/28/23   Hilty, Vinie BROCKS, MD    Allergies: Patient has no known allergies.    Review of Systems See HPI  Updated Vital Signs BP (!) 180/109 (BP Location: Right Arm)   Pulse 72   Temp 98 F (36.7 C)   Resp 16   SpO2 100%   Physical Exam Constitutional:      Appearance: Normal appearance.  HENT:     Head: Normocephalic.     Nose: Nose normal.  Eyes:     Conjunctiva/sclera: Conjunctivae normal.  Pulmonary:     Effort: Pulmonary effort is normal.  Skin:    Comments: Four punctate lesions with surrounding  erythema but no fluctuance in the left lower quadrant of the abdomen and left lower flank.  See picture below  Neurological:     Mental Status: He is alert.  Psychiatric:        Mood and Affect: Mood normal.       (all labs ordered are listed, but only abnormal results are displayed) Labs Reviewed - No data to display  EKG: None  Radiology: No results found.   Procedures   Medications Ordered in the ED  cephALEXin  (KEFLEX ) capsule 500 mg (has no administration in time range)  ibuprofen  (ADVIL ) tablet 600 mg (has no administration in time range)                                    Medical Decision Making Risk Prescription drug management.   53 year old well-appearing male presenting for bug bites.  Exam findings are consistent with bite lesions in the left lower quadrant of the abdomen.  No fluctuance or purulence noted.  Started on Keflex  for what appears to be mild associated cellulitis.  Advised him to follow-up with his PCP.  Discussed return precautions.  Discharged in good condition.     Final diagnoses:  Insect bite,  unspecified site, initial encounter    ED Discharge Orders          Ordered    cephALEXin  (KEFLEX ) 500 MG capsule  4 times daily        07/14/24 0736             Cece Milhouse K, PA-C 07/14/24 9263    Tegeler, Lonni PARAS, MD 07/14/24 [phone]

## 2024-07-14 NOTE — Discharge Instructions (Signed)
 Evaluation today revealed that you may have a skin infection around what appears to be bug bites in your left lower abdomen.  Starting on Keflex  which is an antibiotic to treat the infection.  Please follow-up your PCP.  You can take Tylenol  ibuprofen  at home for pain.

## 2024-08-17 ENCOUNTER — Other Ambulatory Visit: Payer: Self-pay | Admitting: Internal Medicine

## 2024-10-29 ENCOUNTER — Other Ambulatory Visit: Payer: Self-pay | Admitting: Internal Medicine

## 2024-11-21 ENCOUNTER — Other Ambulatory Visit: Payer: Self-pay | Admitting: Internal Medicine

## 2024-11-24 ENCOUNTER — Other Ambulatory Visit: Payer: Self-pay | Admitting: Internal Medicine

## 2024-12-15 ENCOUNTER — Other Ambulatory Visit: Payer: Self-pay | Admitting: Internal Medicine

## 2024-12-25 ENCOUNTER — Other Ambulatory Visit: Payer: Self-pay | Admitting: Internal Medicine

## 2024-12-26 ENCOUNTER — Other Ambulatory Visit: Payer: Self-pay | Admitting: Internal Medicine

## 2025-01-12 ENCOUNTER — Other Ambulatory Visit: Payer: Self-pay | Admitting: Internal Medicine

## 2025-01-18 ENCOUNTER — Other Ambulatory Visit: Payer: Self-pay | Admitting: Internal Medicine

## 2025-01-20 ENCOUNTER — Telehealth: Payer: Self-pay | Admitting: Internal Medicine

## 2025-01-20 ENCOUNTER — Other Ambulatory Visit: Payer: Self-pay | Admitting: Internal Medicine

## 2025-01-20 NOTE — Telephone Encounter (Signed)
 Hi. Pt came into office req a refill for atorvastatin , aspirin  & carvedilol . Pt stated he needs this medication. Please advise. Thank you.

## 2025-01-23 ENCOUNTER — Other Ambulatory Visit (HOSPITAL_COMMUNITY): Payer: Self-pay

## 2025-01-23 ENCOUNTER — Telehealth: Payer: Self-pay | Admitting: Internal Medicine

## 2025-01-23 ENCOUNTER — Other Ambulatory Visit: Payer: Self-pay | Admitting: Internal Medicine

## 2025-01-23 DIAGNOSIS — I214 Non-ST elevation (NSTEMI) myocardial infarction: Secondary | ICD-10-CM

## 2025-01-23 MED ORDER — ASPIRIN 81 MG PO TBEC
81.0000 mg | DELAYED_RELEASE_TABLET | Freq: Every day | ORAL | 0 refills | Status: DC
  Filled 2025-01-23: qty 15, 3d supply, fill #0

## 2025-01-23 MED ORDER — ASPIRIN 81 MG PO TBEC
81.0000 mg | DELAYED_RELEASE_TABLET | Freq: Every day | ORAL | 0 refills | Status: AC
  Filled 2025-01-23: qty 15, 15d supply, fill #0

## 2025-01-23 MED ORDER — ASPIRIN 81 MG PO TBEC: 81.0000 mg | DELAYED_RELEASE_TABLET | Freq: Every day | ORAL | 0 refills | Status: DC

## 2025-01-23 MED ORDER — ATORVASTATIN CALCIUM 80 MG PO TABS: 80.0000 mg | ORAL_TABLET | Freq: Every day | ORAL | 0 refills | Status: DC

## 2025-01-23 MED ORDER — ATORVASTATIN CALCIUM 80 MG PO TABS
80.0000 mg | ORAL_TABLET | Freq: Every day | ORAL | 0 refills | Status: AC
  Filled 2025-01-23: qty 15, 15d supply, fill #0

## 2025-01-23 MED ORDER — CARVEDILOL 6.25 MG PO TABS: 6.2500 mg | ORAL_TABLET | Freq: Two times a day (BID) | ORAL | 0 refills | Status: DC

## 2025-01-23 MED ORDER — CARVEDILOL 6.25 MG PO TABS
6.2500 mg | ORAL_TABLET | Freq: Two times a day (BID) | ORAL | 0 refills | Status: AC
  Filled 2025-01-23: qty 30, 15d supply, fill #0

## 2025-01-23 NOTE — Addendum Note (Signed)
 Addended by: DARRELL BAREFOOT on: 01/23/2025 12:45 PM   Modules accepted: Orders

## 2025-01-23 NOTE — Telephone Encounter (Signed)
Refills were sent earlier this morning.

## 2025-01-23 NOTE — Telephone Encounter (Signed)
 Pt scheduled 01/30/25, will send in a refill.

## 2025-01-30 ENCOUNTER — Ambulatory Visit: Payer: MEDICAID | Admitting: Internal Medicine
